# Patient Record
Sex: Female | Born: 1955 | Race: White | Hispanic: No | Marital: Married | Smoking: Former smoker
Health system: Southern US, Community
[De-identification: ages and names within clinical notes are randomized; demographics above are authoritative.]

## PROBLEM LIST (undated history)

## (undated) DIAGNOSIS — E785 Hyperlipidemia, unspecified: Secondary | ICD-10-CM

## (undated) DIAGNOSIS — H409 Unspecified glaucoma: Secondary | ICD-10-CM

## (undated) DIAGNOSIS — I480 Paroxysmal atrial fibrillation: Secondary | ICD-10-CM

## (undated) DIAGNOSIS — IMO0001 Reserved for inherently not codable concepts without codable children: Secondary | ICD-10-CM

## (undated) DIAGNOSIS — G4700 Insomnia, unspecified: Secondary | ICD-10-CM

## (undated) DIAGNOSIS — IMO0002 Reserved for concepts with insufficient information to code with codable children: Secondary | ICD-10-CM

## (undated) DIAGNOSIS — E669 Obesity, unspecified: Secondary | ICD-10-CM

## (undated) DIAGNOSIS — R001 Bradycardia, unspecified: Secondary | ICD-10-CM

## (undated) DIAGNOSIS — Z780 Asymptomatic menopausal state: Secondary | ICD-10-CM

## (undated) HISTORY — DX: Paroxysmal atrial fibrillation: I48.0

## (undated) HISTORY — PX: TONSILLECTOMY: SUR1361

## (undated) HISTORY — DX: Insomnia, unspecified: G47.00

## (undated) HISTORY — DX: Unspecified glaucoma: H40.9

## (undated) HISTORY — DX: Hyperlipidemia, unspecified: E78.5

## (undated) HISTORY — DX: Asymptomatic menopausal state: Z78.0

## (undated) HISTORY — PX: APPENDECTOMY: SHX54

## (undated) HISTORY — DX: Bradycardia, unspecified: R00.1

## (undated) HISTORY — DX: Obesity, unspecified: E66.9

## (undated) HISTORY — DX: Reserved for concepts with insufficient information to code with codable children: IMO0002

## (undated) HISTORY — PX: BUNIONECTOMY: SHX129

## (undated) HISTORY — PX: LAMINECTOMY: SHX219

## (undated) HISTORY — DX: Reserved for inherently not codable concepts without codable children: IMO0001

---

## 1998-08-06 ENCOUNTER — Encounter: Payer: Self-pay | Admitting: Obstetrics and Gynecology

## 1998-08-06 ENCOUNTER — Ambulatory Visit (HOSPITAL_COMMUNITY): Admission: RE | Admit: 1998-08-06 | Discharge: 1998-08-06 | Payer: Self-pay | Admitting: Obstetrics and Gynecology

## 1998-10-21 ENCOUNTER — Other Ambulatory Visit: Admission: RE | Admit: 1998-10-21 | Discharge: 1998-10-21 | Payer: Self-pay | Admitting: Obstetrics and Gynecology

## 1998-10-30 ENCOUNTER — Ambulatory Visit (HOSPITAL_COMMUNITY): Admission: RE | Admit: 1998-10-30 | Discharge: 1998-10-30 | Payer: Self-pay | Admitting: Obstetrics and Gynecology

## 1998-10-30 ENCOUNTER — Encounter: Payer: Self-pay | Admitting: Obstetrics and Gynecology

## 1999-10-16 ENCOUNTER — Ambulatory Visit (HOSPITAL_COMMUNITY): Admission: RE | Admit: 1999-10-16 | Discharge: 1999-10-16 | Payer: Self-pay | Admitting: Obstetrics and Gynecology

## 1999-10-16 ENCOUNTER — Encounter: Payer: Self-pay | Admitting: Obstetrics and Gynecology

## 2000-05-17 ENCOUNTER — Other Ambulatory Visit: Admission: RE | Admit: 2000-05-17 | Discharge: 2000-05-17 | Payer: Self-pay | Admitting: Obstetrics and Gynecology

## 2004-09-25 ENCOUNTER — Other Ambulatory Visit: Admission: RE | Admit: 2004-09-25 | Discharge: 2004-09-25 | Payer: Self-pay | Admitting: Family Medicine

## 2004-10-23 ENCOUNTER — Ambulatory Visit (HOSPITAL_COMMUNITY): Admission: RE | Admit: 2004-10-23 | Discharge: 2004-10-23 | Payer: Self-pay | Admitting: Family Medicine

## 2005-12-25 ENCOUNTER — Ambulatory Visit (HOSPITAL_COMMUNITY): Admission: RE | Admit: 2005-12-25 | Discharge: 2005-12-25 | Payer: Self-pay | Admitting: Family Medicine

## 2007-01-06 ENCOUNTER — Ambulatory Visit (HOSPITAL_COMMUNITY): Admission: RE | Admit: 2007-01-06 | Discharge: 2007-01-06 | Payer: Self-pay | Admitting: Family Medicine

## 2007-01-25 ENCOUNTER — Other Ambulatory Visit: Admission: RE | Admit: 2007-01-25 | Discharge: 2007-01-25 | Payer: Self-pay | Admitting: Family Medicine

## 2008-01-17 ENCOUNTER — Ambulatory Visit (HOSPITAL_COMMUNITY): Admission: RE | Admit: 2008-01-17 | Discharge: 2008-01-18 | Payer: Self-pay | Admitting: Neurosurgery

## 2008-08-08 ENCOUNTER — Ambulatory Visit (HOSPITAL_COMMUNITY): Admission: RE | Admit: 2008-08-08 | Discharge: 2008-08-08 | Payer: Self-pay | Admitting: Family Medicine

## 2009-09-27 ENCOUNTER — Ambulatory Visit (HOSPITAL_COMMUNITY): Admission: RE | Admit: 2009-09-27 | Discharge: 2009-09-27 | Payer: Self-pay | Admitting: Family Medicine

## 2009-10-08 ENCOUNTER — Emergency Department (HOSPITAL_COMMUNITY): Admission: EM | Admit: 2009-10-08 | Discharge: 2009-10-08 | Payer: Self-pay | Admitting: Emergency Medicine

## 2010-10-31 ENCOUNTER — Ambulatory Visit (HOSPITAL_COMMUNITY)
Admission: RE | Admit: 2010-10-31 | Discharge: 2010-10-31 | Payer: Self-pay | Source: Home / Self Care | Admitting: Family Medicine

## 2011-03-19 LAB — CBC
HCT: 43.9 % (ref 36.0–46.0)
Hemoglobin: 15.2 g/dL — ABNORMAL HIGH (ref 12.0–15.0)
MCHC: 34.5 g/dL (ref 30.0–36.0)
MCV: 92.5 fL (ref 78.0–100.0)
Platelets: 366 10*3/uL (ref 150–400)
RBC: 4.75 MIL/uL (ref 3.87–5.11)
RDW: 12.8 % (ref 11.5–15.5)
WBC: 10.8 10*3/uL — ABNORMAL HIGH (ref 4.0–10.5)

## 2011-03-19 LAB — POCT CARDIAC MARKERS
CKMB, poc: 1.6 ng/mL (ref 1.0–8.0)
CKMB, poc: 1.6 ng/mL (ref 1.0–8.0)
Myoglobin, poc: 47.8 ng/mL (ref 12–200)
Troponin i, poc: <0.05 ng/mL (ref 0.00–0.09)

## 2011-03-19 LAB — BASIC METABOLIC PANEL
BUN: 26 mg/dL — ABNORMAL HIGH (ref 6–23)
Calcium: 9.2 mg/dL (ref 8.4–10.5)
Creatinine, Ser: 0.67 mg/dL (ref 0.4–1.2)
GFR calc Af Amer: >60 mL/min (ref >60)
Sodium: 138 mEq/L (ref 135–145)

## 2011-03-19 LAB — URINALYSIS, ROUTINE W REFLEX MICROSCOPIC
Bilirubin Urine: NEGATIVE
Nitrite: NEGATIVE
Specific Gravity, Urine: 1.016 (ref 1.005–1.030)

## 2011-03-19 LAB — DIFFERENTIAL
Basophils Absolute: 0.1 10*3/uL (ref 0.0–0.1)
Basophils Relative: 1 % (ref 0–1)
Eosinophils Absolute: 0.1 10*3/uL (ref 0.0–0.7)
Eosinophils Relative: 1 % (ref 0–5)
Lymphocytes Relative: 28 % (ref 12–46)
Lymphs Abs: 3 10*3/uL (ref 0.7–4.0)
Monocytes Absolute: 0.6 10*3/uL (ref 0.1–1.0)
Monocytes Relative: 6 % (ref 3–12)
Neutro Abs: 7 10*3/uL (ref 1.7–7.7)
Neutrophils Relative %: 64 % (ref 43–77)

## 2011-03-19 LAB — D-DIMER, QUANTITATIVE: D-Dimer, Quant: <0.22 ug/mL-FEU (ref 0.00–0.48)

## 2011-04-28 NOTE — Op Note (Signed)
Stacy Silva, Stacy Silva           ACCOUNT NO.:  [id number]   MEDICAL RECORD NO.:  [id number]          PATIENT TYPE:  AMB   LOCATION:  SDS                          FACILITY:  [facility]   PHYSICIAN:  Clydene Fake, M.D.  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   POSTOPERATIVE DIAGNOSIS:  Thoracic spondylosis stenosis with myelopathy  T10-11.   PROCEDURE:  T9-12 decompressive laminectomy (three levels).   SURGEON:  Clydene Fake, M.D.   ASSISTANT:  Payton Doughty, M.D.   ANESTHESIA:  General endotracheal anesthesia.   ESTIMATED BLOOD LOSS:  Minima.   BLOOD REPLACED:  None.   DRAINS:  None.   COMPLICATIONS:  None.   INDICATIONS FOR PROCEDURE:  Patient a 55 year old woman who  has been  having back pain, tingling in the legs, found to be myelopathic on exam  with increased reflexes and clonus.  MRI shows thoracic spondylosis  stenosis at 10-11 level with compression of spinal cord.  Patient  brought in for decompression.   DESCRIPTION OF PROCEDURE:  General anesthesia induced.  Patient was  placed in appropriate position on Wilson frame.  All pressure points  padded.  Patient was prepped and draped in sterile fashion.  Needle was  placed in interspace at two spots.  Lateral x-rays obtained showing the  top needle was at the T11-12 interspinous space.  Incision was then made  in the midline thoracic spine from T9-12.  Incision down to fascia.  Hemostasis obtained with Bovie cauterization.  Subperiosteal dissection  was done over the spinous process of lamina out to the facets from  bottom of T9 to top of T12.  Self-retaining retractors were placed.  Markers were placed in either spinous space at 10-11 and 11-12.  Another  lateral x-ray was obtained confirming our positioning.  Decompressive  laminectomy was then performed  from the bottom of T9 to the top part of  T12 using Leksell rongeurs and then a high speed drill and then Kerrison  punches.  There is some significant posterior and lateral hypertrophic  ligaments at the 10-11 level and we decompressed this.  When we were  finished, we had good decompression of the thecal sac and spinal cord.  Hemostasis obtained with Gelfoam and thrombin.  This was then irrigated  out.  We had good hemostasis.  Retractors were removed and the fascia  was closed with 0 Vicryl interrupted sutures and subcutaneous tissue  closed with 0, 2-0  and 3-0 Vicryl interrupted sutures.  Skin closed with Dermabond and  light dressing was placed when that was dry.  Patient was placed back in  supine position, awoken from anesthesia and transferred to the recovery  room in stable condition.           ______________________________  Clydene Fake, M.D.     JRH/MEDQ  D:  01/17/2008  T:  01/17/2008  Job:  [id number]

## 2011-08-29 ENCOUNTER — Emergency Department (HOSPITAL_COMMUNITY)
Admission: EM | Admit: 2011-08-29 | Discharge: 2011-08-30 | Disposition: A | Discharge status: Home / Self Care | Payer: BC Managed Care – PPO | Attending: Emergency Medicine | Admitting: Emergency Medicine

## 2011-08-29 DIAGNOSIS — H409 Unspecified glaucoma: Secondary | ICD-10-CM | POA: Insufficient documentation

## 2011-08-29 DIAGNOSIS — I4891 Unspecified atrial fibrillation: Secondary | ICD-10-CM | POA: Insufficient documentation

## 2011-08-30 ENCOUNTER — Emergency Department (HOSPITAL_COMMUNITY): Payer: BC Managed Care – PPO

## 2011-08-30 LAB — DIFFERENTIAL
Basophils Absolute: 0 K/uL (ref 0.0–0.1)
Basophils Relative: 0 % (ref 0–1)
Eosinophils Absolute: 0.2 10*3/uL (ref 0.0–0.7)
Eosinophils Relative: 2 % (ref 0–5)
Lymphocytes Relative: 33 % (ref 12–46)
Lymphs Abs: 3 10*3/uL (ref 0.7–4.0)
Monocytes Absolute: 0.7 10*3/uL (ref 0.1–1.0)
Monocytes Relative: 7 % (ref 3–12)
Neutro Abs: 5.3 K/uL (ref 1.7–7.7)
Neutrophils Relative %: 58 % (ref 43–77)

## 2011-08-30 LAB — URINALYSIS, ROUTINE W REFLEX MICROSCOPIC
Bilirubin Urine: NEGATIVE
Glucose, UA: NEGATIVE mg/dL
Hgb urine dipstick: NEGATIVE
Ketones, ur: NEGATIVE mg/dL
Leukocytes, UA: NEGATIVE
Nitrite: NEGATIVE
Protein, ur: NEGATIVE mg/dL
Specific Gravity, Urine: 1.014 (ref 1.005–1.030)
Urobilinogen, UA: 0.2 mg/dL (ref 0.0–1.0)
pH: 7.5 (ref 5.0–8.0)

## 2011-08-30 LAB — CBC
HCT: 40.6 % (ref 36.0–46.0)
Hemoglobin: 14.5 g/dL (ref 12.0–15.0)
MCH: 30.5 pg (ref 26.0–34.0)
MCHC: 35.7 g/dL (ref 30.0–36.0)
MCV: 85.3 fL (ref 78.0–100.0)
Platelets: 339 K/uL (ref 150–400)
RBC: 4.76 MIL/uL (ref 3.87–5.11)
RDW: 12.3 % (ref 11.5–15.5)
WBC: 9.2 10*3/uL (ref 4.0–10.5)

## 2011-08-30 LAB — APTT: aPTT: 38 seconds — ABNORMAL HIGH (ref 24–37)

## 2011-08-30 LAB — BASIC METABOLIC PANEL
BUN: 24 mg/dL — ABNORMAL HIGH (ref 6–23)
Calcium: 9.8 mg/dL (ref 8.4–10.5)
Creatinine, Ser: 0.96 mg/dL (ref 0.50–1.10)
GFR calc Af Amer: >60 mL/min (ref >60)
GFR calc non Af Amer: >60 mL/min (ref >60)

## 2011-08-30 LAB — BASIC METABOLIC PANEL WITH GFR
CO2: 26 meq/L (ref 19–32)
Chloride: 102 meq/L (ref 96–112)
Glucose, Bld: 129 mg/dL — ABNORMAL HIGH (ref 70–99)
Potassium: 3.4 meq/L — ABNORMAL LOW (ref 3.5–5.1)
Sodium: 140 meq/L (ref 135–145)

## 2011-08-30 LAB — POCT I-STAT TROPONIN I: Troponin i, poc: 0 ng/mL (ref 0.00–0.08)

## 2011-08-30 LAB — PROTIME-INR
INR: 0.95 (ref 0.00–1.49)
Prothrombin Time: 12.9 s (ref 11.6–15.2)

## 2011-09-04 LAB — CBC
MCV: 89.7
Platelets: 334

## 2011-09-04 LAB — BASIC METABOLIC PANEL
CO2: 28
Calcium: 9.8
Creatinine, Ser: 0.7

## 2011-09-04 LAB — URINALYSIS, ROUTINE W REFLEX MICROSCOPIC
Bilirubin Urine: NEGATIVE
Hgb urine dipstick: NEGATIVE
Specific Gravity, Urine: 1.022
Urobilinogen, UA: 0.2

## 2011-09-04 LAB — APTT: aPTT: 34

## 2011-09-04 LAB — PROTIME-INR: Prothrombin Time: 12.6

## 2011-11-20 ENCOUNTER — Other Ambulatory Visit (HOSPITAL_COMMUNITY): Payer: Self-pay | Admitting: Family Medicine

## 2011-11-20 DIAGNOSIS — Z1231 Encounter for screening mammogram for malignant neoplasm of breast: Secondary | ICD-10-CM

## 2011-11-25 ENCOUNTER — Ambulatory Visit (HOSPITAL_COMMUNITY)
Admission: RE | Admit: 2011-11-25 | Discharge: 2011-11-25 | Disposition: A | Discharge status: Home / Self Care | Payer: BC Managed Care – PPO | Source: Ambulatory Visit | Attending: Family Medicine | Admitting: Family Medicine

## 2011-11-25 DIAGNOSIS — Z1231 Encounter for screening mammogram for malignant neoplasm of breast: Secondary | ICD-10-CM

## 2012-04-06 ENCOUNTER — Other Ambulatory Visit (HOSPITAL_COMMUNITY)
Admission: RE | Admit: 2012-04-06 | Discharge: 2012-04-06 | Disposition: A | Discharge status: Home / Self Care | Payer: BC Managed Care – PPO | Source: Ambulatory Visit | Attending: Family Medicine | Admitting: Family Medicine

## 2012-04-06 ENCOUNTER — Other Ambulatory Visit: Payer: Self-pay | Admitting: Family Medicine

## 2012-04-06 DIAGNOSIS — Z1159 Encounter for screening for other viral diseases: Secondary | ICD-10-CM | POA: Insufficient documentation

## 2012-04-06 DIAGNOSIS — Z124 Encounter for screening for malignant neoplasm of cervix: Secondary | ICD-10-CM | POA: Insufficient documentation

## 2012-06-15 ENCOUNTER — Other Ambulatory Visit: Payer: Self-pay | Admitting: Gastroenterology

## 2012-09-07 IMAGING — CR DG CHEST 1V PORT
1 series · 1 of 1 positions shown · non-contrast
Comparison: Chest radiograph performed 10/08/2009

CLINICAL DATA: Chest pain and palpitations; atrial fibrillation.

PORTABLE CHEST - 1 VIEW

[AP]
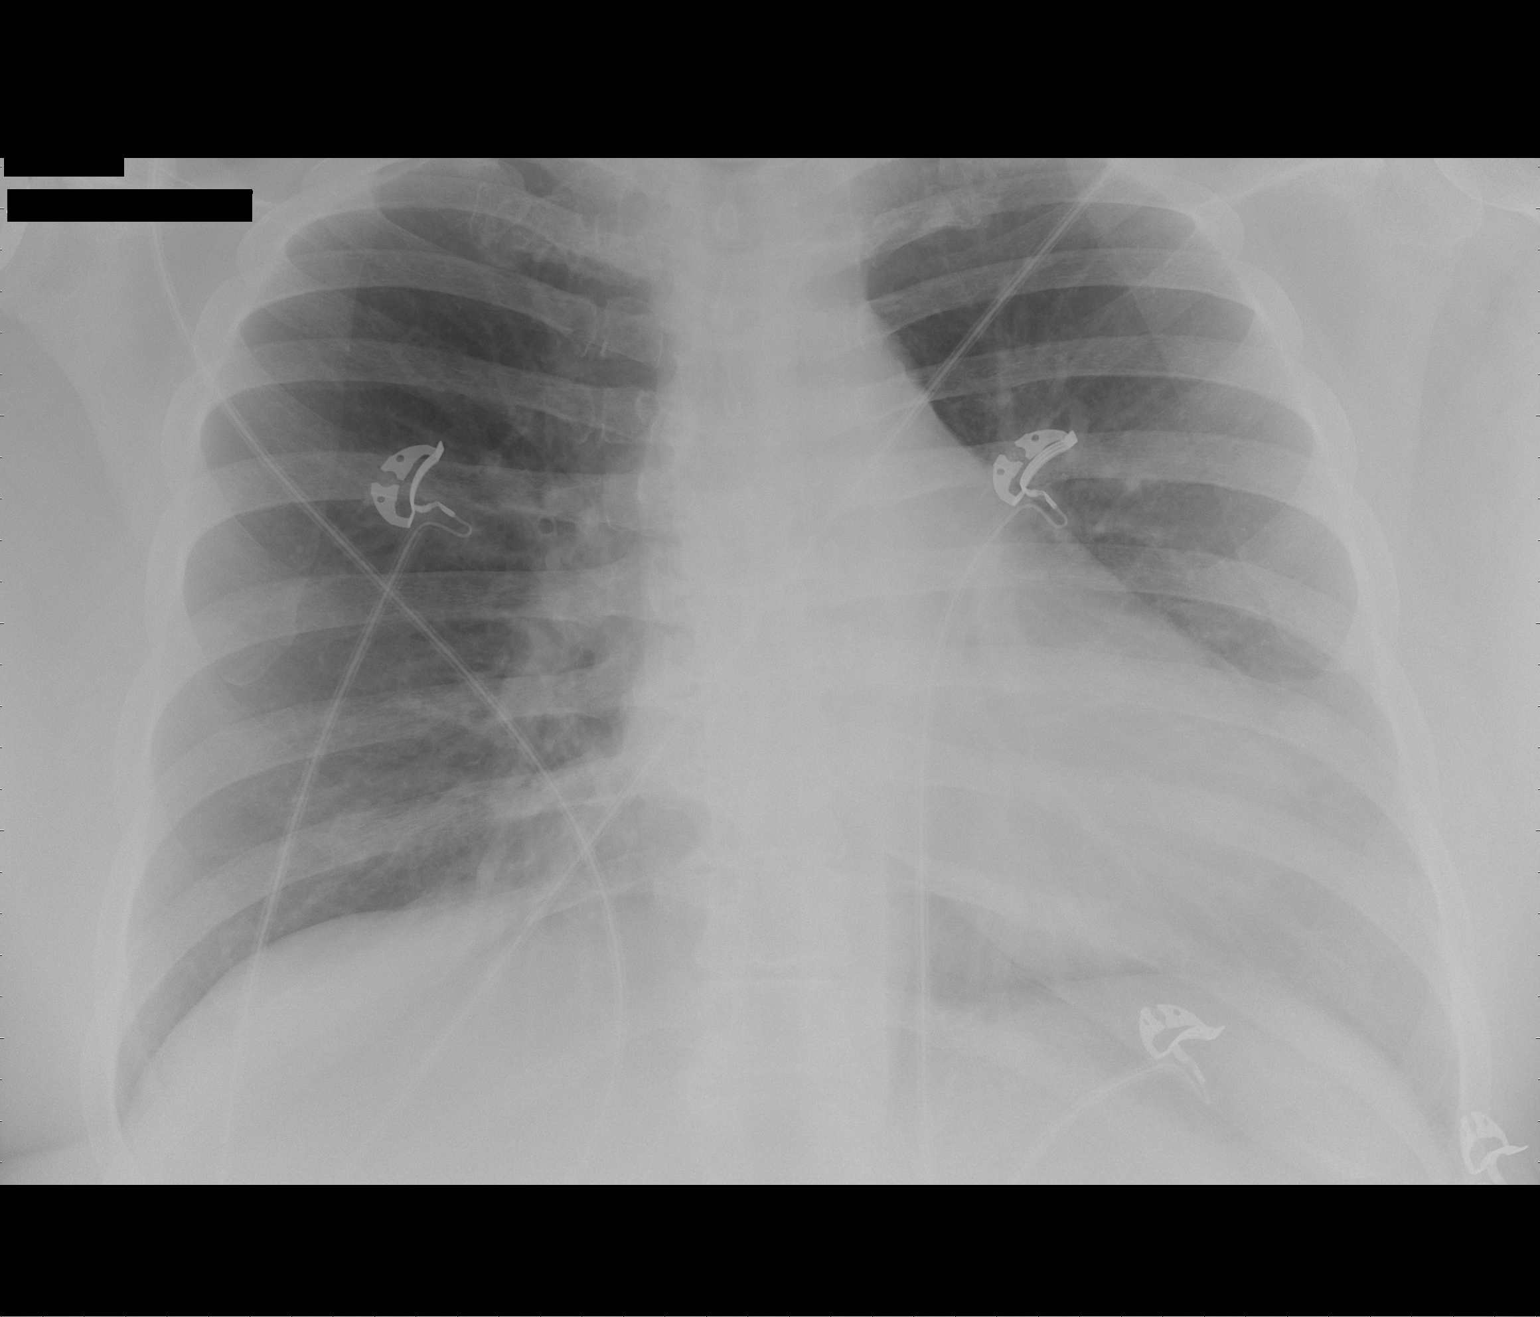

[1 of 1 positions shown; findings below may reference images not displayed]

FINDINGS: The lungs are well-aerated and appear essentially clear.
There is no evidence of focal opacification, pleural effusion or
pneumothorax.

The cardiomediastinal silhouette is mildly enlarged.  No acute
osseous abnormalities are seen.
IMPRESSION: Mild cardiomegaly; no acute cardiopulmonary process seen.

## 2012-11-14 ENCOUNTER — Other Ambulatory Visit (HOSPITAL_COMMUNITY): Payer: Self-pay | Admitting: Family Medicine

## 2012-11-14 DIAGNOSIS — Z1231 Encounter for screening mammogram for malignant neoplasm of breast: Secondary | ICD-10-CM

## 2012-12-01 ENCOUNTER — Ambulatory Visit (HOSPITAL_COMMUNITY)
Admission: RE | Admit: 2012-12-01 | Discharge: 2012-12-01 | Disposition: A | Discharge status: Home / Self Care | Payer: BC Managed Care – PPO | Source: Ambulatory Visit | Attending: Family Medicine | Admitting: Family Medicine

## 2012-12-01 DIAGNOSIS — Z1231 Encounter for screening mammogram for malignant neoplasm of breast: Secondary | ICD-10-CM

## 2013-09-24 ENCOUNTER — Encounter: Payer: Self-pay | Admitting: Cardiology

## 2013-09-24 ENCOUNTER — Encounter: Payer: Self-pay | Admitting: *Deleted

## 2013-09-27 ENCOUNTER — Ambulatory Visit: Payer: BC Managed Care – PPO | Admitting: Cardiology

## 2013-09-29 ENCOUNTER — Ambulatory Visit (INDEPENDENT_AMBULATORY_CARE_PROVIDER_SITE_OTHER): Payer: BC Managed Care – PPO | Admitting: Cardiology

## 2013-09-29 ENCOUNTER — Encounter: Payer: Self-pay | Admitting: Cardiology

## 2013-09-29 VITALS — BP 100/76 | HR 50 | Ht 63.0 in | Wt 175.0 lb

## 2013-09-29 DIAGNOSIS — E669 Obesity, unspecified: Secondary | ICD-10-CM

## 2013-09-29 DIAGNOSIS — E785 Hyperlipidemia, unspecified: Secondary | ICD-10-CM

## 2013-09-29 DIAGNOSIS — I4891 Unspecified atrial fibrillation: Secondary | ICD-10-CM

## 2013-09-29 NOTE — Patient Instructions (Signed)
Your physician has requested that you have an echocardiogram. Echocardiography is a painless test that uses sound waves to create images of your heart. It provides your doctor with information about the size and shape of your heart and how well your heart's chambers and valves are working. This procedure takes approximately one hour. There are no restrictions for this procedure.  You have been referred to Dr. Johney Frame for Atrial Fibrillation.  Your physician wants you to follow-up in: 6 months with Dr. Anne Fu. You will receive a reminder letter in the mail two months in advance. If you don't receive a letter, please call our office to schedule the follow-up appointment.

## 2013-09-29 NOTE — Progress Notes (Signed)
[address] [place], [place]  [address] Phone: [phone] Fax:  [phone]  Date:  09/29/2013   ID:  Silva, Stacy 1956/11/01, MRN [id number]  PCP:  Gweneth Dimitri, MD   History of Present Illness: Stacy Silva is a 57 y.o. female with paroxysmal atrial fibrillation here for followup. First episode was November 2010. Echocardiogram, normal left atrial size. Heart rate was as high as 160 at that time. She also had sinus bradycardia with rate of 43 with no symptoms. She's been feeling palpitations approximate once every 2-3 weeks at last discussion. We discussed the possibility of antiarrhythmic such as flecainide but she wished to hold off at that time. Currently on a combination of diltiazem and metoprolol low-dose.  Having afib, once every 2 weeks. Starts at night then lasts about 24-36 hours. Varies in how debilitating it is. Dilt keeps heart rate under control and has less energy with AFIB.  Now feeling like heart is fluttering but still in rhythm. Does not drink at all because she is worried about trigger. She has not had a previous sleep study but she does not snore nor does she have daytime somnolence.   Wt Readings from Last 3 Encounters:  09/29/13 175 lb (79.379 kg)     Past Medical History  Diagnosis Date  . Obesity   . Back pain   . Myalgia and myositis, unspecified   . Insomnia   . Atrial fibrillation   . Hyperlipidemia   . Hyperlipidemia   . Glaucoma   . Atrial fibrillation   . Postmenopausal     Past Surgical History  Procedure Laterality Date  . Tonsillectomy    . Appendectomy    . Bunionectomy    . Laminectomy      Current Outpatient Prescriptions  Medication Sig Dispense Refill  . aspirin 325 MG tablet Take 325 mg by mouth daily.      . cyclobenzaprine (FLEXERIL) 10 MG tablet Take 10 mg by mouth 3 (three) times daily as needed for muscle spasms.      Marland Kitchen diltiazem (CARDIZEM) 60 MG tablet Take 60 mg by mouth as  needed.      . metoprolol succinate (TOPROL-XL) 50 MG 24 hr tablet Take ONE AND HALF TABLET IN THE MORNING AND TWO TABLET AT NIGHT DAILY      . Multiple Vitamin (MULTIVITAMIN) capsule Take 1 capsule by mouth daily.      . NON FORMULARY TAKE ONE PILL OF PROTANDIM DAILY      . Omega-3 Fatty Acids ([employer] FISH OIL) 1000 MG CAPS Take by mouth. TAKE 2 CAPSULES DAILY       No current facility-administered medications for this visit.    Allergies:    Allergies  Allergen Reactions  . Amoxil [Amoxicillin]     SKIN RASH  . Erythromycin     CAUSE NAUSEA AND VOMITING  . Tape     RASH AND BLISTERS    Social History:  The patient  reports that she has quit smoking. She does not have any smokeless tobacco history on file. She reports that she does not drink alcohol or use illicit drugs.   ROS:  Please see the history of present illness.   No bleeding, no syncope, no orthopnea, no PND  All other systems reviewed and negative.   PHYSICAL EXAM: VS:  BP 100/76  Pulse 50  Ht 5\' 3"  (1.6 m)  Wt 175 lb (79.379 kg)  BMI 31.01  kg/m2  SpO2 97% Well nourished, well developed, in no acute distress HEENT: normal Neck: no JVD Cardiac:  normal S1, S2; bradycardic but regular,no murmur Lungs:  clear to auscultation bilaterally, no wheezing, rhonchi or rales Abd: soft, nontender, no hepatomegaly Ext: no edema Skin: warm and dry Neuro: no focal abnormalities noted  EKG:  Sinus bradycardia rate 50, possible left atrial enlargement Echocardiogram: 2010-left atrial size 39 mm. Normal EF Nuclear stress test: 2012-low-risk, no ischemia, normal EF     ASSESSMENT AND PLAN:  1. Paroxysmal atrial fibrillation-she is now having more frequent episodes, slightly longer duration. At last visit 6 months ago we discussed the possibility of antiarrhythmic medications. She seems willing to proceed at this point and I would like for her to sit down and discuss her case with Dr. Hillis Range electrophysiology. I expressed  that given her paroxysmal atrial fibrillation, she is in an advantage for current antiarrhythmic/possible ablative therapy. I will also repeat echocardiogram since it has been 4 years to detect her left atrial dimension. CHADS-VAS is 1 (Female), hence no anticoagulation currently.  Signed, Donato Schultz, MD [facility]  09/29/2013 3:50 PM

## 2013-09-30 ENCOUNTER — Encounter: Payer: Self-pay | Admitting: Cardiology

## 2013-09-30 DIAGNOSIS — E785 Hyperlipidemia, unspecified: Secondary | ICD-10-CM | POA: Insufficient documentation

## 2013-09-30 DIAGNOSIS — E669 Obesity, unspecified: Secondary | ICD-10-CM | POA: Insufficient documentation

## 2013-10-12 ENCOUNTER — Other Ambulatory Visit: Payer: Self-pay | Admitting: Cardiology

## 2013-10-18 ENCOUNTER — Ambulatory Visit (HOSPITAL_COMMUNITY): Payer: BC Managed Care – PPO | Attending: Cardiology | Admitting: Cardiology

## 2013-10-18 DIAGNOSIS — I4891 Unspecified atrial fibrillation: Secondary | ICD-10-CM | POA: Insufficient documentation

## 2013-10-18 DIAGNOSIS — Z6831 Body mass index (BMI) 31.0-31.9, adult: Secondary | ICD-10-CM | POA: Insufficient documentation

## 2013-10-18 DIAGNOSIS — E785 Hyperlipidemia, unspecified: Secondary | ICD-10-CM | POA: Insufficient documentation

## 2013-10-18 DIAGNOSIS — E669 Obesity, unspecified: Secondary | ICD-10-CM | POA: Insufficient documentation

## 2013-10-18 NOTE — Progress Notes (Signed)
Echo performed. 

## 2013-11-13 ENCOUNTER — Ambulatory Visit (INDEPENDENT_AMBULATORY_CARE_PROVIDER_SITE_OTHER): Payer: BC Managed Care – PPO | Admitting: Internal Medicine

## 2013-11-13 ENCOUNTER — Encounter: Payer: Self-pay | Admitting: Internal Medicine

## 2013-11-13 VITALS — BP 110/70 | HR 52 | Ht 63.0 in | Wt 177.1 lb

## 2013-11-13 DIAGNOSIS — I4891 Unspecified atrial fibrillation: Secondary | ICD-10-CM

## 2013-11-13 MED ORDER — FLECAINIDE ACETATE 50 MG PO TABS: 50.0000 mg | ORAL_TABLET | Freq: Two times a day (BID) | ORAL | Status: DC

## 2013-11-13 NOTE — Progress Notes (Signed)
Primary Care Physician: Gweneth Dimitri, MD Referring Physician:  Dr Anne Fu   Stacy Silva is a 57 y.o. female with a h/o paroxysmal atrial fibrillation who presents today for EP consultation.  She was initially diagnosed with atrial fibrillation 09/2009 after presenting to [facility] with symptoms of palpitations and dizziness.  She was confirmed to have afib.  In retrospect, she has had similar symptoms for at least a year prior.  She spontaneously converted to sinus rhythm.  She had another episode 3 weeks later while wearing an event monitor.  She was placed on metoprolol and ASA.  She reports increasing frequency and duration of her atrial fibrillation.  Presently, episodes occur every 2 weeks and last 1-2 days at a time.  She has not tolerated long acting diltiazem.  She has not tried AAD therapy.  She has not required cardioversion previously.  She is unaware of triggers or precipitants for her afib.  She has rarely felt that 2 glasses of champaign will cause afib.  During afib, she has palpitations and fatigue.  She feels like she has been "hit by a truck" afterwards.  She has some chest tightness with her afib.  Today, she denies symptoms of shortness of breath, orthopnea, PND, lower extremity edema, syncope, or neurologic sequela.  She has had several brief presyncopal events when she converts from afib to sinus though this is infrequent. The patient is tolerating medications without difficulties and is otherwise without complaint today.   Past Medical History  Diagnosis Date  . Obesity   . DDD (degenerative disc disease)   . Myalgia and myositis, unspecified   . Insomnia   . Hyperlipidemia   . Glaucoma   . Paroxysmal atrial fibrillation   . Postmenopausal    Past Surgical History  Procedure Laterality Date  . Tonsillectomy    . Appendectomy    . Bunionectomy    . Laminectomy      Current Outpatient Prescriptions  Medication Sig Dispense Refill  . aspirin 325 MG tablet  Take 325 mg by mouth daily.      . cyclobenzaprine (FLEXERIL) 10 MG tablet Take 10 mg by mouth 3 (three) times daily as needed for muscle spasms.      Marland Kitchen diltiazem (CARDIZEM) 60 MG tablet Take 60 mg by mouth as needed.      Marland Kitchen ELIDEL 1 % cream Apply 1 application topically daily.      Marland Kitchen latanoprost (XALATAN) 0.005 % ophthalmic solution Place 1 drop into both eyes at bedtime.      . metoprolol succinate (TOPROL-XL) 50 MG 24 hr tablet take 1 and 1/2 tablets by mouth every morning and 2 every evening once daily  105 tablet  5  . Multiple Vitamin (MULTIVITAMIN) capsule Take 1 capsule by mouth daily.      . NON FORMULARY TAKE ONE PILL OF PROTANDIM DAILY      . Omega-3 Fatty Acids ([employer] FISH OIL) 1000 MG CAPS TAKE 2 CAPSULES DAILY       No current facility-administered medications for this visit.    Allergies  Allergen Reactions  . Amoxil [Amoxicillin]     SKIN RASH  . Erythromycin     CAUSE NAUSEA AND VOMITING  . Tape     RASH AND BLISTERS    History   Social History  . Marital Status: Married    Spouse Name: N/A    Number of Children: N/A  . Years of Education: N/A   Occupational History  . Not on  file.   Social History Main Topics  . Smoking status: Former Games developer  . Smokeless tobacco: Not on file     Comment: PATIENT QUIT ON 10/ 26/2010  . Alcohol Use: Yes     Comment: rare  . Drug Use: No  . Sexual Activity: Not on file   Other Topics Concern  . Not on file   Social History Narrative   Pt lives in [place] with spouse.  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 Heart attack Maternal Grandfather     DECEASED  . Congestive Heart Failure Father     DECEASD  . Heart attack Father     DECEASED  . Heart attack Paternal Grandfather     DECEASED    ROS- All systems are reviewed and negative except as per the HPI above  Physical Exam: Filed Vitals:   11/13/13 1417  BP: 110/70  Pulse: 52    Height: 5\' 3"  (1.6 m)  Weight: 177 lb 1.9 oz (80.341 kg)    GEN- The patient is well appearing, alert and oriented x 3 today.   Head- normocephalic, atraumatic Eyes-  Sclera clear, conjunctiva pink Ears- hearing intact Oropharynx- clear Neck- supple, no JVP Lymph- no cervical lymphadenopathy Lungs- Clear to ausculation bilaterally, normal work of breathing Heart- Regular rate and rhythm, no murmurs, rubs or gallops, PMI not laterally displaced GI- soft, NT, ND, + BS Extremities- no clubbing, cyanosis, or edema MS- no significant deformity or atrophy Skin- no rash or lesion Psych- euthymic mood, full affect Neuro- strength and sensation are intact  EKG today reveals sinus rhythm 52 bpm, PR 160, QRS 86, QTc 425 otherwise normal ekg Echo 10/18/13- EF 60-65%, no significant valvular disease, LA size is 39 mm  Assessment and Plan:  1. Paroxysmal atrial fibrillation The patient has symptomatic paroxysmal atrial fibrillation.  She has not tried AAD therapy.  Her CHADS2VASC score is 1.  She is therefore not on anticoagulation as per guidelines.  Therapeutic strategies for afib including medicine and ablation were discussed in detail with the patient today. Risk, benefits, and alternatives to EP study and radiofrequency ablation for afib were also discussed in detail today.  At this point, we would prefer to try AAD therapy.  I will therefore initiate flecainide 50mg  BID.  This can be further uptitrated to 100mg  BID if her afib is not controlled.  She will follow-up with Dr Anne Fu in 6 weeks.  IF her afib is improved with flecainide then she will need a GXT myoview at that point to evaluate for ischemia/ exercise induced arrhythmias.  If she has further afib or does not tolerate flecainide then she may be willing to consider ablation at that time. She will follow with Dr Anne Fu going forward and will contact my office should she wish to consider ablation.

## 2013-11-13 NOTE — Patient Instructions (Signed)
Your physician recommends that you schedule a follow-up appointment in: 6 weeks with Dr Anne Fu   Your physician has recommended you make the following change in your medication:  1) Start Flecainide 50mg  twice daily

## 2013-11-20 ENCOUNTER — Other Ambulatory Visit (HOSPITAL_COMMUNITY): Payer: Self-pay | Admitting: Family Medicine

## 2013-11-20 DIAGNOSIS — Z1231 Encounter for screening mammogram for malignant neoplasm of breast: Secondary | ICD-10-CM

## 2013-12-05 ENCOUNTER — Ambulatory Visit (HOSPITAL_COMMUNITY): Payer: BC Managed Care – PPO

## 2013-12-19 ENCOUNTER — Ambulatory Visit (HOSPITAL_COMMUNITY)
Admission: RE | Admit: 2013-12-19 | Discharge: 2013-12-19 | Disposition: A | Discharge status: Home / Self Care | Payer: BC Managed Care – PPO | Source: Ambulatory Visit | Attending: Family Medicine | Admitting: Family Medicine

## 2013-12-19 DIAGNOSIS — Z1231 Encounter for screening mammogram for malignant neoplasm of breast: Secondary | ICD-10-CM

## 2013-12-25 ENCOUNTER — Encounter: Payer: Self-pay | Admitting: Cardiology

## 2013-12-25 ENCOUNTER — Ambulatory Visit (INDEPENDENT_AMBULATORY_CARE_PROVIDER_SITE_OTHER): Payer: BC Managed Care – PPO | Admitting: Cardiology

## 2013-12-25 ENCOUNTER — Encounter: Payer: Self-pay | Admitting: Internal Medicine

## 2013-12-25 VITALS — BP 112/76 | HR 56 | Ht 63.0 in | Wt 178.0 lb

## 2013-12-25 DIAGNOSIS — Z79899 Other long term (current) drug therapy: Secondary | ICD-10-CM

## 2013-12-25 DIAGNOSIS — I4891 Unspecified atrial fibrillation: Secondary | ICD-10-CM

## 2013-12-25 MED ORDER — FLECAINIDE ACETATE 100 MG PO TABS: 100.0000 mg | ORAL_TABLET | Freq: Two times a day (BID) | ORAL | Status: DC

## 2013-12-25 NOTE — Progress Notes (Signed)
[place], [place],   [address] Phone: [phone] Fax:  [phone]  Date:  12/25/2013   ID:  Stacy, Silva [birth year], MRN [id number]  PCP:  Cari Caraway, MD   History of Present Illness: Stacy Silva is a 58 y.o. female with paroxysmal atrial fibrillation here for followup. First episode was November 2010. Echocardiogram, normal left atrial size. Heart rate was as high as 160 at that time. She also had sinus bradycardia with rate of 43 with no symptoms. She's been feeling palpitations approximate once every 2-3 weeks at last discussion. We discussed the possibility of antiarrhythmic such as flecainide but she wished to hold off at that time. Currently on a combination of diltiazem and metoprolol low-dose.  Having afib, once every 2 weeks. Starts at night then lasts about 24-36 hours. Varies in how debilitating it is. Dilt keeps heart rate under control and has less energy with AFIB.  Now feeling like heart is fluttering but still in rhythm. Does not drink at all because she is worried about trigger. She has not had a previous sleep study but she does not snore nor does she have daytime somnolence.  12/25/13-she has seen Dr. Rayann Heman in consultation who started her on flecainide 50 mg twice a day. She has had much less frequent bouts of atrial fibrillation. Her longest bout was approximately 12 hours which is much improved. No syncope, no bleeding, no orthopnea, no PND. EKG reassuring.  Wt Readings from Last 3 Encounters:  12/25/13 178 lb (80.74 kg)  11/13/13 177 lb 1.9 oz (80.341 kg)  09/29/13 175 lb (79.379 kg)     Past Medical History  Diagnosis Date  . Obesity   . DDD (degenerative disc disease)   . Myalgia and myositis, unspecified   . Insomnia   . Hyperlipidemia   . Glaucoma   . Paroxysmal atrial fibrillation   . Postmenopausal     Past Surgical History  Procedure Laterality Date  . Tonsillectomy    . Appendectomy    .  Bunionectomy    . Laminectomy      Current Outpatient Prescriptions  Medication Sig Dispense Refill  . aspirin 325 MG tablet Take 325 mg by mouth daily.      . cyclobenzaprine (FLEXERIL) 10 MG tablet Take 10 mg by mouth 3 (three) times daily as needed for muscle spasms.      Marland Kitchen diltiazem (CARDIZEM) 60 MG tablet Take 60 mg by mouth as needed.      Marland Kitchen ELIDEL 1 % cream Apply 1 application topically daily.      . flecainide (TAMBOCOR) 50 MG tablet Take 1 tablet (50 mg total) by mouth 2 (two) times daily.  180 tablet  3  . latanoprost (XALATAN) 0.005 % ophthalmic solution Place 1 drop into both eyes at bedtime.      . metoprolol succinate (TOPROL-XL) 50 MG 24 hr tablet take 1 and 1/2 tablets by mouth every morning and 2 every evening once daily  105 tablet  5  . Multiple Vitamin (MULTIVITAMIN) capsule Take 1 capsule by mouth daily.      . NON FORMULARY TAKE ONE PILL OF PROTANDIM DAILY      . Omega-3 Fatty Acids ([employer] FISH OIL) 1000 MG CAPS TAKE 2 CAPSULES DAILY       No current facility-administered medications for this visit.    Allergies:    Allergies  Allergen Reactions  . Amoxil [Amoxicillin]  SKIN RASH  . Erythromycin     CAUSE NAUSEA AND VOMITING  . Tape     RASH AND BLISTERS    Social History:  The patient  reports that she has quit smoking. She does not have any smokeless tobacco history on file. She reports that she drinks alcohol. She reports that she does not use illicit drugs.   ROS:  Please see the history of present illness.   No bleeding, no syncope, no orthopnea, no PND  All other systems reviewed and negative.   PHYSICAL EXAM: VS:  BP 112/76  Pulse 56  Ht 5\' 3"  (1.6 m)  Wt 178 lb (80.74 kg)  BMI 31.54 kg/m2  SpO2 96% Well nourished, well developed, in no acute distress HEENT: normal Neck: no JVD Cardiac:  normal S1, S2; bradycardic but regular,no murmur Lungs:  clear to auscultation bilaterally, no wheezing, rhonchi or rales Abd: soft, nontender, no  hepatomegaly Ext: no edema Skin: warm and dry Neuro: no focal abnormalities noted  EKG: 12/25/13- Sinus bradycardia rate 53, possible left atrial enlargement Echocardiogram: 10/2013- left atrial size 39 mm. Normal EF Nuclear stress test: 2012-low-risk, no ischemia, normal EF     ASSESSMENT AND PLAN:  1. Paroxysmal atrial fibrillation-recently started flecainide 50 mg twice a day. Dr. Rayann Heman saw in  because she is still having bouts of A. fib, I will increase her flecainide 100 mg twice a day. EKG today shows QRS duration of 84 ms. Sinus bradycardia 53 beats per minute. CHADS-VAS is 1 (Female), hence no anticoagulation currently. Left atrial size is normal. Nuclear stress test in 2012 with no ischemia. I will go ahead and set her up for an exercise treadmill test to ensure that she does not have any exercise-induced arrhythmias with flecainide. Overall, she is very satisfied with the use of antiarrhythmic drugs. She states that she wishes to on these earlier. 2. We will see her back in 2 months.  Signed, Candee Furbish, MD [facility]  12/25/2013 11:53 AM

## 2013-12-25 NOTE — Patient Instructions (Signed)
Your physician recommends that you schedule a follow-up appointment in: 2 months with Dr. Marlou Porch  Your physician has requested that you have an exercise tolerance test. For further information please visit [email or web address]. Please also follow instruction sheet, as given.  Your physician has recommended you make the following change in your medication:   1. Increase your Flecainide to 100 mg twice daily

## 2014-01-04 ENCOUNTER — Ambulatory Visit (HOSPITAL_COMMUNITY)
Admission: RE | Admit: 2014-01-04 | Discharge: 2014-01-04 | Disposition: A | Discharge status: Home / Self Care | Payer: BC Managed Care – PPO | Source: Ambulatory Visit | Attending: Internal Medicine | Admitting: Internal Medicine

## 2014-01-04 DIAGNOSIS — I4891 Unspecified atrial fibrillation: Secondary | ICD-10-CM | POA: Insufficient documentation

## 2014-02-26 ENCOUNTER — Ambulatory Visit (INDEPENDENT_AMBULATORY_CARE_PROVIDER_SITE_OTHER): Payer: BC Managed Care – PPO | Admitting: Cardiology

## 2014-02-26 ENCOUNTER — Encounter: Payer: Self-pay | Admitting: Cardiology

## 2014-02-26 VITALS — BP 116/60 | HR 64 | Ht 63.0 in | Wt 177.0 lb

## 2014-02-26 DIAGNOSIS — Z79899 Other long term (current) drug therapy: Secondary | ICD-10-CM

## 2014-02-26 DIAGNOSIS — R5383 Other fatigue: Secondary | ICD-10-CM

## 2014-02-26 DIAGNOSIS — R5381 Other malaise: Secondary | ICD-10-CM

## 2014-02-26 DIAGNOSIS — I4891 Unspecified atrial fibrillation: Secondary | ICD-10-CM

## 2014-02-26 NOTE — Progress Notes (Signed)
[place]. [address] [place], [place]  [address] Phone: [phone] Fax:  [phone]  Date:  02/26/2014   ID:  Stacy Silva, Stacy Silva February 25, 1956, MRN [id number]  PCP:  Cari Caraway, MD   History of Present Illness: Stacy Silva is a 58 y.o. female with paroxysmal atrial fibrillation here for followup. First episode was November 2010. Echocardiogram, normal left atrial size. Heart rate was as high as 160 at that time. She also had sinus bradycardia with rate of 43 with no symptoms. She's been feeling palpitations approximate once every 2-3 weeks at last discussion. We discussed the possibility of antiarrhythmic such as flecainide but she wished to hold off at that time. Currently on a combination of diltiazem and metoprolol low-dose.  Having afib, once every 2 weeks. Starts at night then lasts about 24-36 hours. Varies in how debilitating it is. Dilt keeps heart rate under control and has less energy with AFIB.  Now feeling like heart is fluttering but still in rhythm. Does not drink at all because she is worried about trigger. She has not had a previous sleep study but she does not snore nor does she have daytime somnolence.  12/25/13-she has seen Dr. Rayann Heman in consultation who started her on flecainide 50 mg twice a day. She has had much less frequent bouts of atrial fibrillation. Her longest bout was approximately 12 hours which is much improved. No syncope, no bleeding, no orthopnea, no PND. EKG reassuring.  02/26/14 - Think she is having only minor symptoms or side effects of medication. On 100mg  BID for a while, backed off to 50 but had more AFIB. Went back to 100 BID. Feels a little sluggish.   Wt Readings from Last 3 Encounters:  02/26/14 177 lb (80.287 kg)  12/25/13 178 lb (80.74 kg)  11/13/13 177 lb 1.9 oz (80.341 kg)     Past Medical History  Diagnosis Date  . Obesity   . DDD (degenerative disc disease)   . Myalgia and myositis, unspecified   .  Insomnia   . Hyperlipidemia   . Glaucoma   . Paroxysmal atrial fibrillation   . Postmenopausal     Past Surgical History  Procedure Laterality Date  . Tonsillectomy    . Appendectomy    . Bunionectomy    . Laminectomy      Current Outpatient Prescriptions  Medication Sig Dispense Refill  . aspirin 325 MG tablet Take 325 mg by mouth daily.      . cyclobenzaprine (FLEXERIL) 10 MG tablet Take 10 mg by mouth 3 (three) times daily as needed for muscle spasms.      Marland Kitchen diltiazem (CARDIZEM) 60 MG tablet Take 60 mg by mouth as needed.      Marland Kitchen ELIDEL 1 % cream Apply 1 application topically daily.      . flecainide (TAMBOCOR) 100 MG tablet Take 1 tablet (100 mg total) by mouth 2 (two) times daily.  180 tablet  3  . latanoprost (XALATAN) 0.005 % ophthalmic solution Place 1 drop into both eyes at bedtime.      . metoprolol succinate (TOPROL-XL) 50 MG 24 hr tablet take 1 and 1/2 tablets by mouth every morning and 2 every evening once daily  105 tablet  5  . Multiple Vitamin (MULTIVITAMIN) capsule Take 1 capsule by mouth daily.      . NON FORMULARY TAKE ONE PILL OF PROTANDIM DAILY      . Omega-3 Fatty Acids ([employer]  FISH OIL) 1000 MG CAPS TAKE 2 CAPSULES DAILY       No current facility-administered medications for this visit.    Allergies:    Allergies  Allergen Reactions  . Amoxil [Amoxicillin]     SKIN RASH  . Erythromycin     CAUSE NAUSEA AND VOMITING  . Tape     RASH AND BLISTERS    Social History:  The patient  reports that she has quit smoking. She does not have any smokeless tobacco history on file. She reports that she drinks alcohol. She reports that she does not use illicit drugs.   ROS:  Please see the history of present illness.   No bleeding, no syncope, no orthopnea, no PND  All other systems reviewed and negative.   PHYSICAL EXAM: VS:  BP 116/60  Pulse 64  Ht 5\' 3"  (1.6 m)  Wt 177 lb (80.287 kg)  BMI 31.36 kg/m2 Well nourished, well developed, in no acute  distress HEENT: normal Neck: no JVD Cardiac:  normal S1, S2; bradycardic but regular,no murmur Lungs:  clear to auscultation bilaterally, no wheezing, rhonchi or rales Abd: soft, nontender, no hepatomegaly Ext: no edema Skin: warm and dry Neuro: no focal abnormalities noted  EKG: 12/25/13- Sinus bradycardia rate 53, possible left atrial enlargement Echocardiogram: 10/2013- left atrial size 39 mm. Normal EF Nuclear stress test: 2012-low-risk, no ischemia, normal EF     ASSESSMENT AND PLAN:  1. Paroxysmal atrial fibrillation- flecainide 100 mg twice a day. EKG shows QRS duration of 84 ms. Sinus bradycardia 53 beats per minute. CHADS-VAS is 1 (Female), hence no anticoagulation currently. Left atrial size is normal. Nuclear stress test in 2012 with no ischemia.Exercise treadmill test reassuring on flecainide. Overall, she is very satisfied with the use of antiarrhythmic drugs. She states that she wishes to on these earlier. 2. Fatigue-mild. Continue to exercise. Overall very impressed. She is to contact us if any worrisome symptoms develop. 3. We will see her back in 6 months.  Signed, Candee Furbish, MD [facility]  02/26/2014 4:12 PM

## 2014-02-26 NOTE — Patient Instructions (Signed)
Your physician recommends that you continue on your current medications as directed. Please refer to the Current Medication list given to you today.  Your physician wants you to follow-up in: 6 months with Dr. Skains. You will receive a reminder letter in the mail two months in advance. If you don't receive a letter, please call our office to schedule the follow-up appointment.  

## 2014-04-05 ENCOUNTER — Other Ambulatory Visit: Payer: Self-pay | Admitting: Cardiology

## 2014-09-28 ENCOUNTER — Other Ambulatory Visit: Payer: Self-pay

## 2014-10-06 ENCOUNTER — Other Ambulatory Visit: Payer: Self-pay | Admitting: Cardiology

## 2014-10-08 ENCOUNTER — Telehealth: Payer: Self-pay | Admitting: Cardiology

## 2014-10-08 MED ORDER — METOPROLOL SUCCINATE ER 50 MG PO TB24: ORAL_TABLET | ORAL | Status: DC

## 2014-10-08 NOTE — Telephone Encounter (Signed)
Refill sent into pharmacy as requested.  Pt also scheduled to f/u with Dr Marlou Porch.

## 2014-10-08 NOTE — Telephone Encounter (Signed)
°  Patient is completely out of medication and Rose has left for the day. She needs Metoprolo. Please call and advise.

## 2014-10-30 ENCOUNTER — Encounter: Payer: Self-pay | Admitting: Cardiology

## 2014-10-30 ENCOUNTER — Ambulatory Visit (INDEPENDENT_AMBULATORY_CARE_PROVIDER_SITE_OTHER): Payer: BC Managed Care – PPO | Admitting: Cardiology

## 2014-10-30 VITALS — BP 116/78 | HR 53 | Ht 63.0 in | Wt 181.0 lb

## 2014-10-30 DIAGNOSIS — I480 Paroxysmal atrial fibrillation: Secondary | ICD-10-CM

## 2014-10-30 NOTE — Patient Instructions (Addendum)
The current medical regimen is effective;  continue present plan and medications.  Follow up in 6 months with Dr. Skains.  You will receive a letter in the mail 2 months before you are due.  Please call us when you receive this letter to schedule your follow up appointment.  Thank you for choosing [facility]!!     

## 2014-10-30 NOTE — Progress Notes (Signed)
[place]. [address] [place], [place]  [address] Phone: [phone] Fax:  [phone]  Date:  10/30/2014   ID:  Stacy, Silva 01/02/1956, MRN [id number]  PCP:  Cari Caraway, MD   History of Present Illness: Stacy Silva is a 58 y.o. female with paroxysmal atrial fibrillation here for followup. First episode was November 2010. Echocardiogram, normal left atrial size. Heart rate was as high as 160 at that time. She also had sinus bradycardia with rate of 43 with no symptoms. She's been feeling palpitations approximate once every 2-3 weeks at last discussion. We discussed the possibility of antiarrhythmic such as flecainide but she wished to hold off at that time. Currently on a combination of diltiazem and metoprolol low-dose.  Having afib, once every 2 weeks. Starts at night then lasts about 24-36 hours. Varies in how debilitating it is. Dilt keeps heart rate under control and has less energy with AFIB.  Now feeling like heart is fluttering but still in rhythm. Does not drink at all because she is worried about trigger. She has not had a previous sleep study but she does not snore nor does she have daytime somnolence.  12/25/13-she has seen Dr. Rayann Heman in consultation who started her on flecainide 50 mg twice a day. She has had much less frequent bouts of atrial fibrillation. Her longest bout was approximately 12 hours which is much improved. No syncope, no bleeding, no orthopnea, no PND. EKG reassuring.  02/26/14 - Think she is having only minor symptoms or side effects of medication. On 100mg  BID for a while, backed off to 50 but had more AFIB. Went back to 100 BID. Feels a little sluggish.   10/30/14-still feeling sluggish/short of breath when walking up stairs but she is able to walk on flat ground without any difficulty. Walked over 6 miles a day in [place] without problems. Dr. Leonides Schanz was interested in starting statin medication however Stacy Silva has  hesitancies. She is concerned about potential side effects with her other medications.  Wt Readings from Last 3 Encounters:  10/30/14 181 lb (82.101 kg)  02/26/14 177 lb (80.287 kg)  12/25/13 178 lb (80.74 kg)     Past Medical History  Diagnosis Date  . Obesity   . DDD (degenerative disc disease)   . Myalgia and myositis, unspecified   . Insomnia   . Hyperlipidemia   . Glaucoma   . Paroxysmal atrial fibrillation   . Postmenopausal     Past Surgical History  Procedure Laterality Date  . Tonsillectomy    . Appendectomy    . Bunionectomy    . Laminectomy      Current Outpatient Prescriptions  Medication Sig Dispense Refill  . aspirin 325 MG tablet Take 325 mg by mouth daily.    Marland Kitchen diltiazem (CARDIZEM) 60 MG tablet Take 60 mg by mouth as needed.    Marland Kitchen ELIDEL 1 % cream Apply 1 application topically daily.    . flecainide (TAMBOCOR) 100 MG tablet Take 1 tablet (100 mg total) by mouth 2 (two) times daily. 180 tablet 3  . latanoprost (XALATAN) 0.005 % ophthalmic solution Place 1 drop into both eyes at bedtime.    . metoprolol succinate (TOPROL-XL) 50 MG 24 hr tablet take 1 and 1/2 tablets by mouth every morning and 2 tablets every evening 105 tablet 5  . Multiple Vitamin (MULTIVITAMIN) capsule Take 1 capsule by mouth daily.    . NON FORMULARY TAKE ONE  PILL OF PROTANDIM DAILY    . Omega-3 Fatty Acids ([employer] FISH OIL) 1000 MG CAPS TAKE 2 CAPSULES DAILY    . zolpidem (AMBIEN) 10 MG tablet Take 5 mg by mouth at bedtime as needed.  0   No current facility-administered medications for this visit.    Allergies:    Allergies  Allergen Reactions  . Amoxil [Amoxicillin]     SKIN RASH  . Erythromycin     CAUSE NAUSEA AND VOMITING  . Tape     RASH AND BLISTERS    Social History:  The patient  reports that she has quit smoking. She does not have any smokeless tobacco history on file. She reports that she drinks alcohol. She reports that she does not use illicit drugs.   ROS:  Please  see the history of present illness.   No bleeding, no syncope, no orthopnea, no PND  All other systems reviewed and negative.   PHYSICAL EXAM: VS:  BP 116/78 mmHg  Pulse 53  Ht 5\' 3"  (1.6 m)  Wt 181 lb (82.101 kg)  BMI 32.07 kg/m2 Well nourished, well developed, in no acute distress HEENT: normal Neck: no JVD Cardiac:  normal S1, S2; bradycardic but regular,no murmur Lungs:  clear to auscultation bilaterally, no wheezing, rhonchi or rales Abd: soft, nontender, no hepatomegaly Ext: no edema Skin: warm and dry Neuro: no focal abnormalities noted  EKG: 12/25/13- Sinus bradycardia rate 53, possible left atrial enlargement Echocardiogram: 10/2013- left atrial size 39 mm. Normal EF Nuclear stress test: 2012-low-risk, no ischemia, normal EF     ASSESSMENT AND PLAN:  1. Paroxysmal atrial fibrillation- flecainide 100 mg twice a day. EKG shows QRS duration of 84 ms. Sinus bradycardia 53 beats per minute. CHADS-VAS is 1 (Female), hence no anticoagulation currently. Left atrial size is normal. Nuclear stress test in 2012 with no ischemia.Exercise treadmill test reassuring on flecainide. Overall, she is very satisfied with the use of antiarrhythmic drugs. She states that she wishes to on these earlier. I'm requesting her lipids/lab work. 2. Fatigue-mild. Continue to exercise. Overall very impressed. She is to contact us if any worrisome symptoms develop. 3. We will see her back in 6 months.  Signed, Candee Furbish, MD [facility]  10/30/2014 11:26 AM

## 2014-10-31 ENCOUNTER — Encounter: Payer: Self-pay | Admitting: Internal Medicine

## 2014-11-30 ENCOUNTER — Ambulatory Visit (INDEPENDENT_AMBULATORY_CARE_PROVIDER_SITE_OTHER): Payer: BC Managed Care – PPO | Admitting: Physician Assistant

## 2014-11-30 ENCOUNTER — Telehealth: Payer: Self-pay | Admitting: Cardiology

## 2014-11-30 ENCOUNTER — Encounter: Payer: Self-pay | Admitting: Physician Assistant

## 2014-11-30 VITALS — BP 118/64 | HR 50 | Ht 63.0 in | Wt 181.0 lb

## 2014-11-30 DIAGNOSIS — E669 Obesity, unspecified: Secondary | ICD-10-CM

## 2014-11-30 DIAGNOSIS — R001 Bradycardia, unspecified: Secondary | ICD-10-CM

## 2014-11-30 DIAGNOSIS — R072 Precordial pain: Secondary | ICD-10-CM

## 2014-11-30 DIAGNOSIS — Z87891 Personal history of nicotine dependence: Secondary | ICD-10-CM

## 2014-11-30 DIAGNOSIS — I480 Paroxysmal atrial fibrillation: Secondary | ICD-10-CM | POA: Insufficient documentation

## 2014-11-30 DIAGNOSIS — E785 Hyperlipidemia, unspecified: Secondary | ICD-10-CM

## 2014-11-30 LAB — BASIC METABOLIC PANEL
BUN: 20 mg/dL (ref 6–23)
CALCIUM: 9.7 mg/dL (ref 8.4–10.5)
CO2: 27 meq/L (ref 19–32)
CREATININE: 0.83 mg/dL (ref 0.50–1.10)
Chloride: 105 mEq/L (ref 96–112)
Glucose, Bld: 103 mg/dL — ABNORMAL HIGH (ref 70–99)
Potassium: 4.1 mEq/L (ref 3.5–5.3)
SODIUM: 143 meq/L (ref 135–145)

## 2014-11-30 LAB — TSH: TSH: 0.902 u[IU]/mL (ref 0.350–4.500)

## 2014-11-30 LAB — CBC
HEMATOCRIT: 42 % (ref 36.0–46.0)
HEMOGLOBIN: 14.3 g/dL (ref 12.0–15.0)
MCH: 30.2 pg (ref 26.0–34.0)
MCHC: 34 g/dL (ref 30.0–36.0)
MCV: 88.6 fL (ref 78.0–100.0)
MPV: 9 fL — AB (ref 9.4–12.4)
Platelets: 345 10*3/uL (ref 150–400)
RBC: 4.74 MIL/uL (ref 3.87–5.11)
RDW: 13.2 % (ref 11.5–15.5)
WBC: 9 10*3/uL (ref 4.0–10.5)

## 2014-11-30 MED ORDER — OMEPRAZOLE 20 MG PO CPDR: 20.0000 mg | DELAYED_RELEASE_CAPSULE | Freq: Two times a day (BID) | ORAL | Status: DC

## 2014-11-30 NOTE — Telephone Encounter (Signed)
Talked to patient about her chest pain. Patient states "I have chest pain that comes and goes. I have some palpations and at times I feel a slight pressure and some burning. I don't feel like I need to go to the ER, but would like to come in and get an EKG." Patient has been having these symptoms off and on since last night. Patient's blood pressure is 114/58 and heart rate 50. Patient thinks she maybe in A. Fib. She took Diltiazem 60 mg last night with no significant improvement. Talked to Dr. Marlou Porch, DOD and the patient's primary cardiologist. He advised patient come in to see [facility], flex for the day. Patient is scheduled to come in to office at 2:30 pm this afternoon. Patient verbalized understanding of this appointment. Advised patient that if she felt worse to go to the ED, and also if she had any other questions to please call back.

## 2014-11-30 NOTE — Telephone Encounter (Signed)
Pt c/o of Chest Pain: 1. Are you having CP right now? yes 2. Are you experiencing any other symptoms (ex. SOB, nausea, vomiting, sweating)? no 3. How long have you been experiencing CP? Last night 4. Is your CP continuous or coming and going? Comes and goes 5. Have you taken Nitroglycerin? No   pt does not have any but has taken a diltiazem at 2am

## 2014-11-30 NOTE — Progress Notes (Signed)
[place], [place], [place]  [address] Phone: [phone] Fax:  [phone]  Date:  11/30/2014   Patient ID:  Stacy Silva, Stacy Silva 31-May-1956, MRN [id number]   PCP:  Cari Caraway, MD  Cardiologist:  Marlou Porch  Electrophysiologist:  Allred   History of Present Illness: Stacy Silva is a 58 y.o. female with history of PAF, sinus bardycardia, HLD (followed by PCP), obesity, former tobacco abuse (30 yrs) who presents as an add-on to flex clinic.   Per notes, she was diagnosed with AF in 2010 with HR up to 160 at that time, also sinus bradycardia with HR of 43 with no symptoms. She had a nonischemic nuc in 2012. Last echo: 10/2013 - EF 60-65%, no RWMA, LV diastolic function parameters were normal. She was previously seen by Dr. Rayann Heman in consultation who started her on flecainide with less frequent bouts of AF.  ETT 12/2013: exercised for 9:01, reached 82% of target, felt reassuring by Dr. Marlou Porch with no evidence of adverse arrhythmias of flecainide. She was feeling sluggish in 02/2014 - flecainide was decreased to 50mg  BID, but she had more AF so went back to 100mg  BID. She was doing well at 10/2014 OV. CHADSVASC = 1 for female only.  She comes in today with a 2-day history of smoldering chest pain. She says she's very sensitive to medicines and 2 days ago she developed an epigastric sensation, similar to the kind of stomach upset "that you get from a vitamin." She has found eating occasionally makes it better but at other times has made it worse. Tums has improved it at times but other times has made no difference. It is not made worse with exertion. She feels better when upright. She has a tendency to notice increased cardiac awareness when lying down to sleep at night, and last night she felt her heart contracting more forceful than usual. Pulse was not erratic or elevated. She felt increased chest discomfort at that time. She took a dilitazem and spent the rest of the night  sitting upward. Currently she just feels fatigued with a minimal low grade chest discomfort. Chest pain is not worse on inspiration. No recent travel, surgery, bedrest. VSS and EKG stable.  Recent Labs: No results found for requested labs within last 365 days.  Wt Readings from Last 3 Encounters:  11/30/14 181 lb (82.101 kg)  10/30/14 181 lb (82.101 kg)  02/26/14 177 lb (80.287 kg)     Past Medical History  Diagnosis Date  . Obesity   . DDD (degenerative disc disease)   . Myalgia and myositis, unspecified   . Insomnia   . Hyperlipidemia   . Glaucoma   . Paroxysmal atrial fibrillation   . Postmenopausal   . Sinus bradycardia     Current Outpatient Prescriptions  Medication Sig Dispense Refill  . aspirin 325 MG tablet Take 325 mg by mouth daily.    Marland Kitchen diltiazem (CARDIZEM) 60 MG tablet Take 60 mg by mouth as needed.    Marland Kitchen ELIDEL 1 % cream Apply 1 application topically daily.    . flecainide (TAMBOCOR) 100 MG tablet Take 1 tablet (100 mg total) by mouth 2 (two) times daily. 180 tablet 3  . latanoprost (XALATAN) 0.005 % ophthalmic solution Place 1 drop into both eyes at bedtime.    . metoprolol succinate (TOPROL-XL) 50 MG 24 hr tablet take 1 and 1/2 tablets by mouth every morning and 2 tablets every evening 105 tablet 5  . Multiple  Vitamin (MULTIVITAMIN) capsule Take 1 capsule by mouth daily.    . NON FORMULARY TAKE ONE PILL OF PROTANDIM DAILY    . Omega-3 Fatty Acids ([employer] FISH OIL) 1000 MG CAPS TAKE 2 CAPSULES DAILY    . zolpidem (AMBIEN) 10 MG tablet Take 5 mg by mouth at bedtime as needed.  0   No current facility-administered medications for this visit.    Allergies:   Amoxil; Erythromycin; and Tape   Social History:  The patient  reports that she has quit smoking. She does not have any smokeless tobacco history on file. She reports that she drinks alcohol. She reports that she does not use illicit drugs.   Family History:  The patient's family history includes CAD in her  father; Congestive Heart Failure in her father; Heart attack in her father, maternal grandfather, and paternal grandfather.   ROS:  Please see the history of present illness. Denies BRBPR, melena, hematmesis.   All other systems reviewed and negative.   PHYSICAL EXAM: VS:  BP 118/64 mmHg  Pulse 50  Ht 5\' 3"  (1.6 m)  Wt 181 lb (82.101 kg)  BMI 32.07 kg/m2 Well nourished, well developed WF, in no acute distress, comfortable appearing HEENT: normal Neck: no JVD, no carotid bruits Cardiac:  normal S1, S2; RRR; no murmur, no rub Lungs:  clear to auscultation bilaterally, no wheezing, rhonchi or rales Abd: soft, nontender, no hepatomegaly Ext: no edema Skin: warm and dry Neuro:  moves all extremities spontaneously, no focal abnormalities noted  EKG:  Sinus bradycardia 50bpm, TWI avL, V2, QTc 456ms - no change from prior  ASSESSMENT AND PLAN:  1. Chest pain - symptoms are somewhat atypical. EKG is nonacute. I suspect possible gastric irritation related to her aspirin vs GERD. Will check CBC to ensure no bleeding and start omeprazole 20mg  BID. Given nature of symptoms and the fact that she is on flecainide, will update her nuclear stress test. ER precautions reviewed. Patient in agreement with plan. Also d/w Dr. Marlou Porch. 2. PAF - maintaining NSR. Update BMET, TSH while on flecainide given recent "forceful" palpitations with normal rate. 3. H/o sinus bradycardia - this is her baseline.  4. Hyperlipidemia - the patient previously self d/c'd Lipitor due to congestion but later realized her daughter also had congestion. She is considering the idea of restarting. Lipids are followed by PCP. 5. Former tobacco abuse - congratulating her on maintaining cessation.  Dispo: F/u Dr. Marlou Porch or APP in 3 weeks.  Signed, Melina Copa, PA-C  11/30/2014 6:06 PM

## 2014-11-30 NOTE — Patient Instructions (Signed)
Your physician has recommended you make the following change in your medication:    START TAKING OMEPRAZOLE 20 TWICE A DAY    LABS TODAY BMET CBC AND TSH   Your physician has requested that you have a lexiscan myoview. For further information please visit [email or web address]. Please follow instruction sheet, as given.   Your physician recommends that you schedule a follow-up appointment in:  WITH DR Marlou Porch IN 3 WEEKS OR AVAIABLE APP

## 2014-12-03 ENCOUNTER — Telehealth: Payer: Self-pay | Admitting: Physician Assistant

## 2014-12-03 NOTE — Telephone Encounter (Signed)
New message        Pt returning carol's call

## 2014-12-03 NOTE — Telephone Encounter (Signed)
pt notified of lab results with verbal understanding 

## 2014-12-11 ENCOUNTER — Ambulatory Visit (HOSPITAL_COMMUNITY): Payer: BC Managed Care – PPO | Attending: Cardiovascular Disease | Admitting: Radiology

## 2014-12-11 DIAGNOSIS — R001 Bradycardia, unspecified: Secondary | ICD-10-CM

## 2014-12-11 DIAGNOSIS — I480 Paroxysmal atrial fibrillation: Secondary | ICD-10-CM

## 2014-12-11 DIAGNOSIS — R079 Chest pain, unspecified: Secondary | ICD-10-CM | POA: Insufficient documentation

## 2014-12-11 DIAGNOSIS — R072 Precordial pain: Secondary | ICD-10-CM

## 2014-12-11 DIAGNOSIS — E785 Hyperlipidemia, unspecified: Secondary | ICD-10-CM | POA: Diagnosis not present

## 2014-12-11 MED ORDER — TECHNETIUM TC 99M SESTAMIBI GENERIC - CARDIOLITE
30.0000 | Freq: Once | INTRAVENOUS | Status: AC | PRN
  Administered 2014-12-11: 30 via INTRAVENOUS

## 2014-12-11 MED ORDER — TECHNETIUM TC 99M SESTAMIBI GENERIC - CARDIOLITE
10.0000 | Freq: Once | INTRAVENOUS | Status: AC | PRN
  Administered 2014-12-11: 10 via INTRAVENOUS

## 2014-12-11 MED ORDER — REGADENOSON 0.4 MG/5ML IV SOLN
0.4000 mg | Freq: Once | INTRAVENOUS | Status: AC
  Administered 2014-12-11: 0.4 mg via INTRAVENOUS

## 2014-12-11 NOTE — Progress Notes (Signed)
[facility] NUCLE[facility] [address] [place], [place] [address] [phone]    Cardiology Nuclear Med Study  Stacy Silva is a 58 y.o. female     MRN : [id number]     DOB: [birth year]  Procedure Date: 12/11/2014  Nuclear Med Background Indication for Stress Test:  Evaluation for Ischemia History:  MPI 2012, Afib Cardiac Risk Factors: Lipids  Symptoms:  Chest Pain   Nuclear Pre-Procedure Caffeine/Decaff Intake:  None> 12 hrs NPO After: 8:30pm   Lungs:  clear O2 Sat: 97% on room air. IV 0.9% NS with Angio Cath:  22g  IV Site: R Antecubital x 1, tolerated well IV Started by:  Irven Baltimore, RN  Chest Size (in):  40 Cup Size: D  Height: 5\' 3"  (1.6 m)  Weight:  185 lb (83.915 kg)  BMI:  Body mass index is 32.78 kg/(m^2). Tech Comments:  Patient took Tambocor this am, and Toprol at 7:00pm yesterday. Irven Baltimore, RN.    Nuclear Med Study 1 or 2 day study: 1 day  Stress Test Type:  Treadmill/Lexiscan  Reading MD: N/A  Order Authorizing Provider:  Candee Furbish, MD, and Melina Copa, PAC  Resting Radionuclide: Technetium 70m Sestamibi  Resting Radionuclide Dose: 11.0 mCi   Stress Radionuclide:  Technetium 42m Sestamibi  Stress Radionuclide Dose: 33.0 mCi           Stress Protocol Rest HR: 52 Stress HR: 81  Rest BP: 132/83 Stress BP: 83/67  Exercise Time (min): n/a METS: n/a   Predicted Max HR: 161 bpm % Max HR: 50.31 bpm Rate Pressure Product: 10449   Dose of Adenosine (mg):  n/a Dose of Lexiscan: 0.4 mg  Dose of Atropine (mg): n/a Dose of Dobutamine: n/a mcg/kg/min (at max HR)  Stress Test Technologist: Glade Lloyd, BS-ES  Nuclear Technologist:  Annye Rusk, CNMT     Rest Procedure:  Myocardial perfusion imaging was performed at rest 45 minutes following the intravenous administration of Technetium 75m Sestamibi. Rest ECG: NSR - Normal EKG  Stress Procedure:  The patient received IV Lexiscan 0.4 mg over 15-seconds with concurrent low level  exercise and then Technetium 26m Sestamibi was injected at 30-seconds while the patient continued walking one more minute.  Quantitative spect images were obtained after a 45-minute delay.  During the infusion of Lexiscan the patient complained of SOB, chest tightness and lightheadedness.  These symptoms subsided in recovery.  Stress ECG: No significant change from baseline ECG  QPS Raw Data Images:  Normal; no motion artifact; normal heart/lung ratio. Stress Images:  Normal homogeneous uptake in all areas of the myocardium. Rest Images:  Normal homogeneous uptake in all areas of the myocardium. Subtraction (SDS):  No evidence of ischemia. Transient Ischemic Dilatation (Normal <1.22):  0.86 Lung/Heart Ratio (Normal <0.45):  0.40  Quantitative Gated Spect Images QGS EDV:  75 ml QGS ESV:  17 ml  Impression Exercise Capacity:  Lexiscan with low level exercise. BP Response:  Normal blood pressure response. Clinical Symptoms:  Mild chest pain/dyspnea. ECG Impression:  No significant ST segment change suggestive of ischemia. Comparison with Prior Nuclear Study: No previous nuclear study performed  Overall Impression:  Normal stress nuclear study.  LV Ejection Fraction: 77%.  LV Wall Motion:  NL LV Function; NL Wall Motion  Darlin Coco MD

## 2014-12-12 ENCOUNTER — Telehealth: Payer: Self-pay | Admitting: Cardiology

## 2014-12-12 NOTE — Telephone Encounter (Signed)
pt notified about myoview and to monitor BP. She states BP generally runs on the lower side and she does already check BP periodically. She has a f/u 1/8 w/Chris Merrilee Jansky, NP Pt said thank you for the good news.

## 2014-12-12 NOTE — Telephone Encounter (Signed)
Follow Up    Pt calling following up on results and call from earlier. Please call back.

## 2014-12-21 ENCOUNTER — Ambulatory Visit: Payer: BC Managed Care – PPO | Admitting: Nurse Practitioner

## 2015-01-04 ENCOUNTER — Ambulatory Visit: Payer: Self-pay | Admitting: Cardiology

## 2015-01-24 ENCOUNTER — Ambulatory Visit: Payer: Self-pay | Admitting: Cardiology

## 2015-01-26 ENCOUNTER — Other Ambulatory Visit: Payer: Self-pay | Admitting: Cardiology

## 2015-01-29 ENCOUNTER — Other Ambulatory Visit (HOSPITAL_COMMUNITY): Payer: Self-pay | Admitting: Family Medicine

## 2015-01-29 DIAGNOSIS — Z1231 Encounter for screening mammogram for malignant neoplasm of breast: Secondary | ICD-10-CM

## 2015-02-05 ENCOUNTER — Ambulatory Visit (HOSPITAL_COMMUNITY)
Admission: RE | Admit: 2015-02-05 | Discharge: 2015-02-05 | Disposition: A | Discharge status: Home / Self Care | Payer: BLUE CROSS/BLUE SHIELD | Source: Ambulatory Visit | Attending: Family Medicine | Admitting: Family Medicine

## 2015-02-05 DIAGNOSIS — Z1231 Encounter for screening mammogram for malignant neoplasm of breast: Secondary | ICD-10-CM | POA: Diagnosis not present

## 2015-02-18 ENCOUNTER — Ambulatory Visit (INDEPENDENT_AMBULATORY_CARE_PROVIDER_SITE_OTHER): Payer: BLUE CROSS/BLUE SHIELD | Admitting: Cardiology

## 2015-02-18 ENCOUNTER — Other Ambulatory Visit: Payer: Self-pay | Admitting: Neurosurgery

## 2015-02-18 ENCOUNTER — Encounter: Payer: Self-pay | Admitting: Cardiology

## 2015-02-18 VITALS — BP 108/72 | HR 55 | Ht 63.0 in | Wt 189.0 lb

## 2015-02-18 DIAGNOSIS — M5126 Other intervertebral disc displacement, lumbar region: Secondary | ICD-10-CM

## 2015-02-18 DIAGNOSIS — E669 Obesity, unspecified: Secondary | ICD-10-CM

## 2015-02-18 DIAGNOSIS — R001 Bradycardia, unspecified: Secondary | ICD-10-CM

## 2015-02-18 DIAGNOSIS — E785 Hyperlipidemia, unspecified: Secondary | ICD-10-CM

## 2015-02-18 DIAGNOSIS — I480 Paroxysmal atrial fibrillation: Secondary | ICD-10-CM

## 2015-02-18 NOTE — Patient Instructions (Signed)
The current medical regimen is effective;  continue present plan and medications.  Follow up in 1 year with Dr. Skains.  You will receive a letter in the mail 2 months before you are due.  Please call us when you receive this letter to schedule your follow up appointment.  Thank you for choosing [facility]!!     

## 2015-02-18 NOTE — Progress Notes (Signed)
[place], [place], [place]  [address] Phone: [phone] Fax:  [phone]  Date:  02/18/2015   Patient ID:  Stacy Silva, Stacy Silva 1955/12/16, MRN [id number]   PCP:  Cari Caraway, MD  Cardiologist:  Marlou Porch  Electrophysiologist:  Allred   History of Present Illness: Stacy Silva is a 59 y.o. female with history of PAF, sinus bardycardia, HLD (followed by PCP), obesity, former tobacco abuse (30 yrs) who had prior chest pain.  Previous visit was as followed: Per notes, she was diagnosed with AF in 2010 with HR up to 160 at that time, also sinus bradycardia with HR of 43 with no symptoms. She had a nonischemic nuc in 2012. Last echo: 10/2013 - EF 60-65%, no RWMA, LV diastolic function parameters were normal. She was previously seen by Dr. Rayann Heman in consultation who started her on flecainide with less frequent bouts of AF.  ETT 12/2013: exercised for 9:01, reached 82% of target, felt reassuring by Dr. Marlou Porch with no evidence of adverse arrhythmias of flecainide. She was feeling sluggish in 02/2014 - flecainide was decreased to 50mg  BID, but she had more AF so went back to 100mg  BID. She was doing well at 10/2014 OV. CHADSVASC = 1 for female only.  She comes in today with a 2-day history of smoldering chest pain. She says she's very sensitive to medicines and 2 days ago she developed an epigastric sensation, similar to the kind of stomach upset "that you get from a vitamin." She has found eating occasionally makes it better but at other times has made it worse. Tums has improved it at times but other times has made no difference. It is not made worse with exertion. She feels better when upright. She has a tendency to notice increased cardiac awareness when lying down to sleep at night, and last night she felt her heart contracting more forceful than usual. Pulse was not erratic or elevated. She felt increased chest discomfort at that time. She took a dilitazem and spent the rest  of the night sitting upward. Currently she just feels fatigued with a minimal low grade chest discomfort. Chest pain is not worse on inspiration. No recent travel, surgery, bedrest. VSS and EKG stable.  Overall she is feeling much better on the PPI.  Recent Labs: 11/30/2014: Creatinine 0.83; Hemoglobin 14.3; Potassium 4.1; TSH 0.902  Wt Readings from Last 3 Encounters:  02/18/15 189 lb (85.73 kg)  12/11/14 185 lb (83.915 kg)  11/30/14 181 lb (82.101 kg)     Past Medical History  Diagnosis Date  . Obesity   . DDD (degenerative disc disease)   . Myalgia and myositis, unspecified   . Insomnia   . Hyperlipidemia   . Glaucoma   . Paroxysmal atrial fibrillation   . Postmenopausal   . Sinus bradycardia     Current Outpatient Prescriptions  Medication Sig Dispense Refill  . aspirin 325 MG tablet Take 325 mg by mouth daily.    Marland Kitchen diltiazem (CARDIZEM) 60 MG tablet Take 60 mg by mouth as needed.    Marland Kitchen ELIDEL 1 % cream Apply 1 application topically daily.    . flecainide (TAMBOCOR) 100 MG tablet Take 1 tablet (100 mg total) by mouth 2 (two) times daily. 180 tablet 3  . latanoprost (XALATAN) 0.005 % ophthalmic solution Place 1 drop into both eyes at bedtime.    . metoprolol succinate (TOPROL-XL) 50 MG 24 hr tablet take 1 and 1/2 tablets by mouth every morning and  2 tablets every evening 105 tablet 5  . Multiple Vitamin (MULTIVITAMIN) capsule Take 1 capsule by mouth daily.    . NON FORMULARY TAKE ONE PILL OF PROTANDIM DAILY    . Omega-3 Fatty Acids ([employer] FISH OIL) 1000 MG CAPS TAKE 2 CAPSULES DAILY    . zolpidem (AMBIEN) 10 MG tablet Take 5 mg by mouth at bedtime as needed.  0   No current facility-administered medications for this visit.    Allergies:   Amoxil; Erythromycin; and Tape   Social History:  The patient  reports that she has quit smoking. She does not have any smokeless tobacco history on file. She reports that she drinks alcohol. She reports that she does not use illicit  drugs.   Family History:  The patient's family history includes CAD in her father; Congestive Heart Failure in her father; Heart attack in her father, maternal grandfather, and paternal grandfather.   ROS:  Please see the history of present illness. Denies BRBPR, melena, hematmesis.   All other systems reviewed and negative.   PHYSICAL EXAM: VS:  BP 108/72 mmHg  Pulse 55  Ht 5\' 3"  (1.6 m)  Wt 189 lb (85.73 kg)  BMI 33.49 kg/m2 Well nourished, well developed WF, in no acute distress, comfortable appearing HEENT: normal Neck: no JVD, no carotid bruits Cardiac:  normal S1, S2; RRR; no murmur, no rub Lungs:  clear to auscultation bilaterally, no wheezing, rhonchi or rales Abd: soft, nontender, no hepatomegaly Ext: no edema Skin: warm and dry Neuro:  moves all extremities spontaneously, no focal abnormalities noted  EKG:  Sinus bradycardia 50bpm, TWI avL, V2, QTc 435ms - no change from prior  ASSESSMENT AND PLAN:  1. Chest pain - GERD -like symptoms. She has tried ranitidine or Pepcid in the past, omeprazole 20mg  BID has worked. Nuclear stress test reassuring, low risk.  2. PAF - maintaining NSR. Very rare palpitations she knows to take diltiazem 60 if she is having an issue. She will lay down. Call us.. 3. H/o sinus bradycardia - this is her baseline.  4. Hyperlipidemia - the patient previously self d/c'd Lipitor due to congestion but later realized her daughter also had congestion. She is considering the idea of restarting. Lipids are followed by PCP. 5. Former tobacco abuse - congratulating her on maintaining cessation.  One-year follow-up  Signed, Candee Furbish, MD   02/18/2015 4:20 PM

## 2015-02-28 ENCOUNTER — Ambulatory Visit
Admission: RE | Admit: 2015-02-28 | Discharge: 2015-02-28 | Disposition: A | Discharge status: Home / Self Care | Payer: BLUE CROSS/BLUE SHIELD | Source: Ambulatory Visit | Attending: Neurosurgery | Admitting: Neurosurgery

## 2015-02-28 VITALS — BP 115/55 | HR 47

## 2015-02-28 DIAGNOSIS — M5126 Other intervertebral disc displacement, lumbar region: Secondary | ICD-10-CM

## 2015-02-28 DIAGNOSIS — M5127 Other intervertebral disc displacement, lumbosacral region: Secondary | ICD-10-CM

## 2015-02-28 MED ORDER — ONDANSETRON HCL 4 MG/2ML IJ SOLN
4.0000 mg | Freq: Once | INTRAMUSCULAR | Status: AC
Start: 2015-02-28 — End: 2015-02-28
  Administered 2015-02-28: 4 mg via INTRAMUSCULAR

## 2015-02-28 MED ORDER — ONDANSETRON HCL 4 MG/2ML IJ SOLN: 4.0000 mg | Freq: Four times a day (QID) | INTRAMUSCULAR | Status: DC | PRN

## 2015-02-28 MED ORDER — IOHEXOL 300 MG/ML  SOLN
10.0000 mL | Freq: Once | INTRAMUSCULAR | Status: AC | PRN
  Administered 2015-02-28: 10 mL via INTRATHECAL

## 2015-02-28 MED ORDER — DIAZEPAM 5 MG PO TABS
10.0000 mg | ORAL_TABLET | Freq: Once | ORAL | Status: AC
  Administered 2015-02-28: 10 mg via ORAL

## 2015-02-28 MED ORDER — MEPERIDINE HCL 100 MG/ML IJ SOLN
75.0000 mg | Freq: Once | INTRAMUSCULAR | Status: AC
  Administered 2015-02-28: 75 mg via INTRAMUSCULAR

## 2015-02-28 NOTE — Discharge Instructions (Signed)

## 2015-02-28 NOTE — Progress Notes (Signed)
Discharge instructions explained to pt. 

## 2015-04-07 ENCOUNTER — Other Ambulatory Visit: Payer: Self-pay | Admitting: Cardiology

## 2015-10-07 ENCOUNTER — Other Ambulatory Visit: Payer: Self-pay | Admitting: Cardiology

## 2016-01-21 ENCOUNTER — Other Ambulatory Visit: Payer: Self-pay

## 2016-01-21 DIAGNOSIS — Z1231 Encounter for screening mammogram for malignant neoplasm of breast: Secondary | ICD-10-CM

## 2016-02-03 ENCOUNTER — Other Ambulatory Visit: Payer: Self-pay | Admitting: Adult Health

## 2016-02-03 NOTE — Telephone Encounter (Signed)
Rx request sent to pharmacy.  

## 2016-02-10 ENCOUNTER — Ambulatory Visit
Admission: RE | Admit: 2016-02-10 | Discharge: 2016-02-10 | Disposition: A | Discharge status: Home / Self Care | Payer: BLUE CROSS/BLUE SHIELD | Source: Ambulatory Visit

## 2016-02-10 DIAGNOSIS — Z1231 Encounter for screening mammogram for malignant neoplasm of breast: Secondary | ICD-10-CM

## 2016-02-14 IMAGING — MG MM SCREENING BREAST TOMO BILAT
8 series · 8 of 24 positions shown · non-contrast
Comparison: Previous exam(s).

CLINICAL DATA: Screening.

EXAM:
DIGITAL SCREENING BILATERAL MAMMOGRAM WITH 3D TOMO WITH CAD

[R MLO]
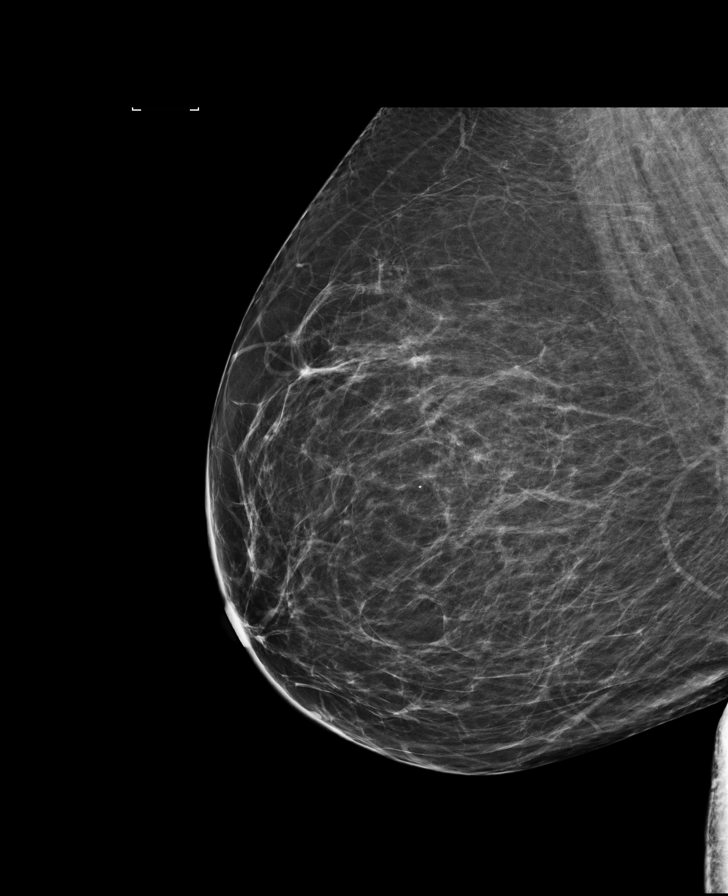

[L MLO]
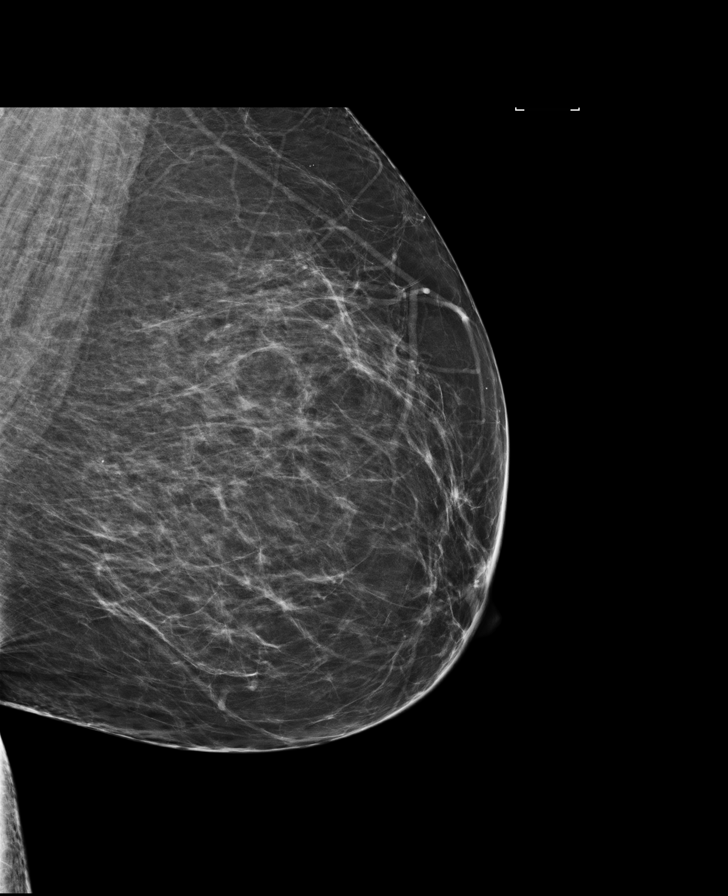

[L CC]
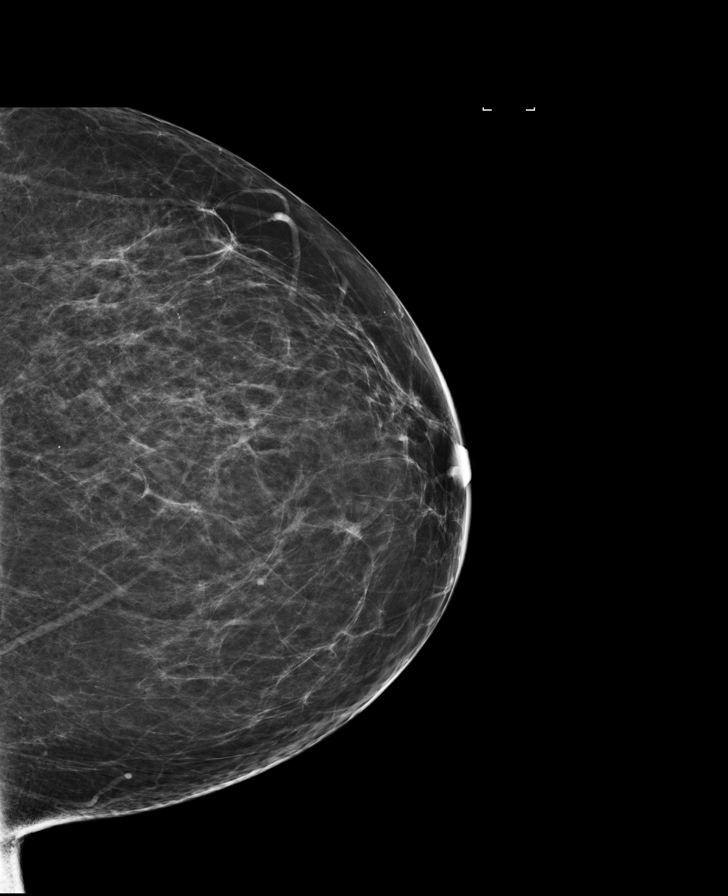

[R CC]
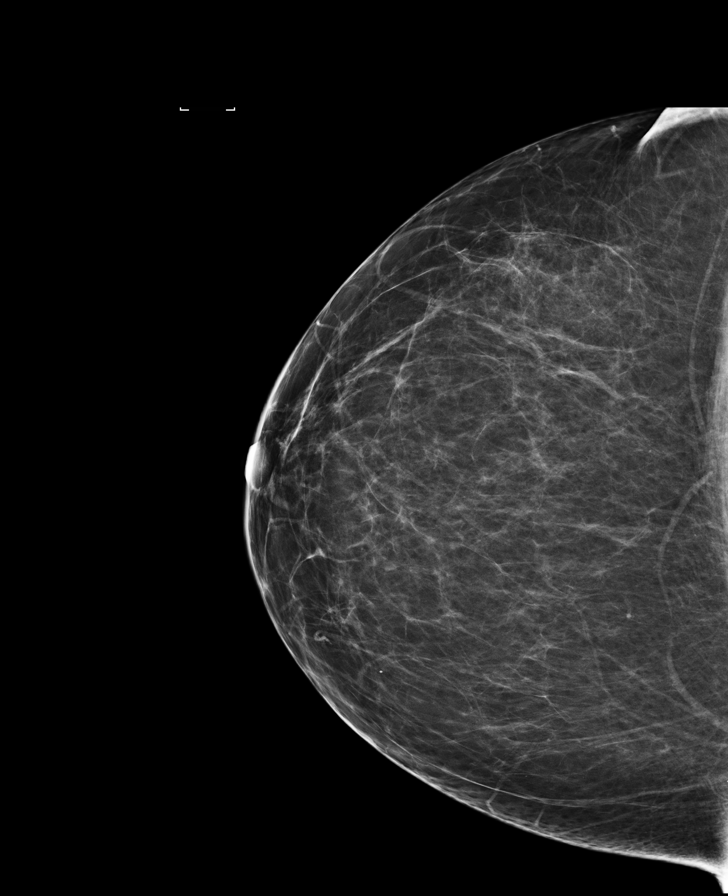

[L MLO tomo · tomo slice 39/78.0]
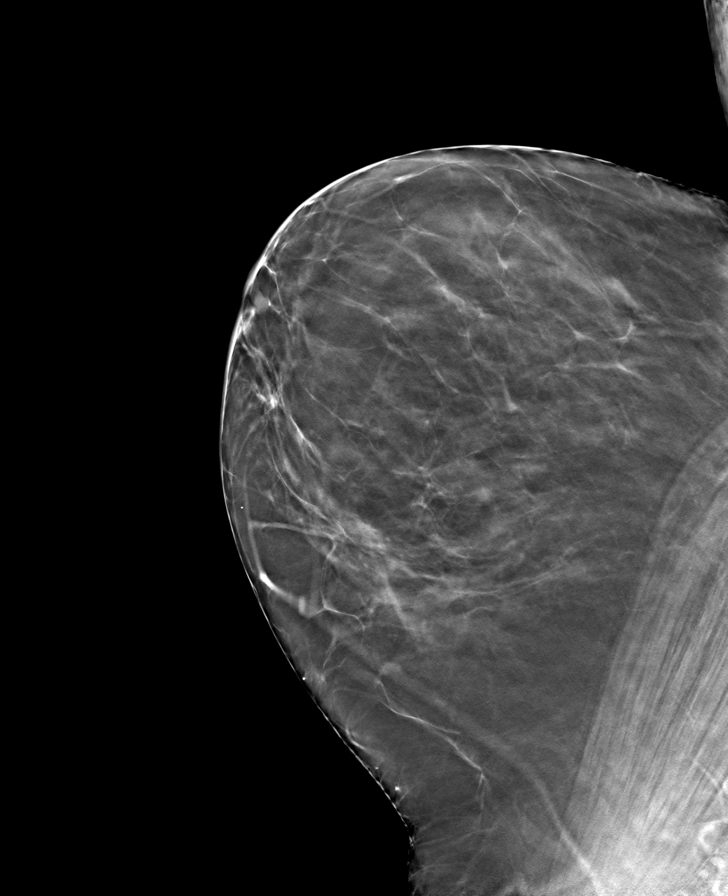

[L CC tomo · tomo slice 41/80.0]
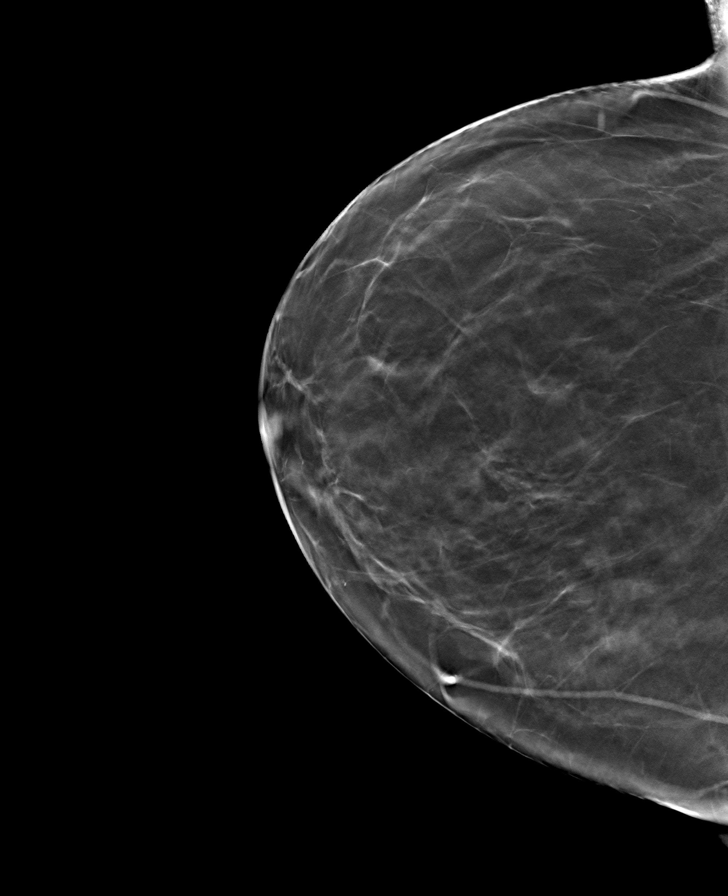

[R CC tomo · tomo slice 43/85.0]
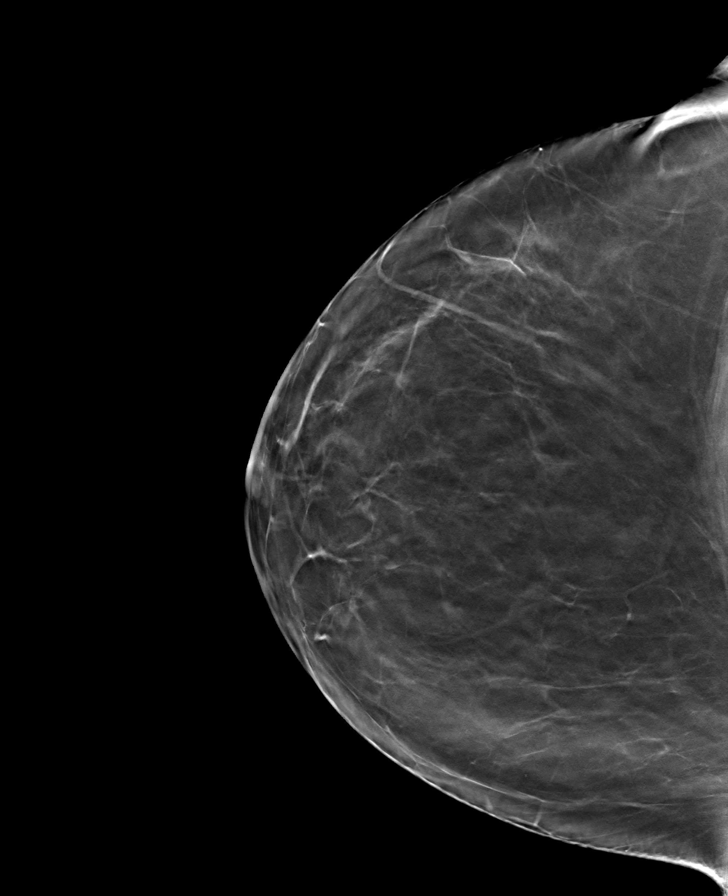

[R MLO tomo · tomo slice 41/80.0]
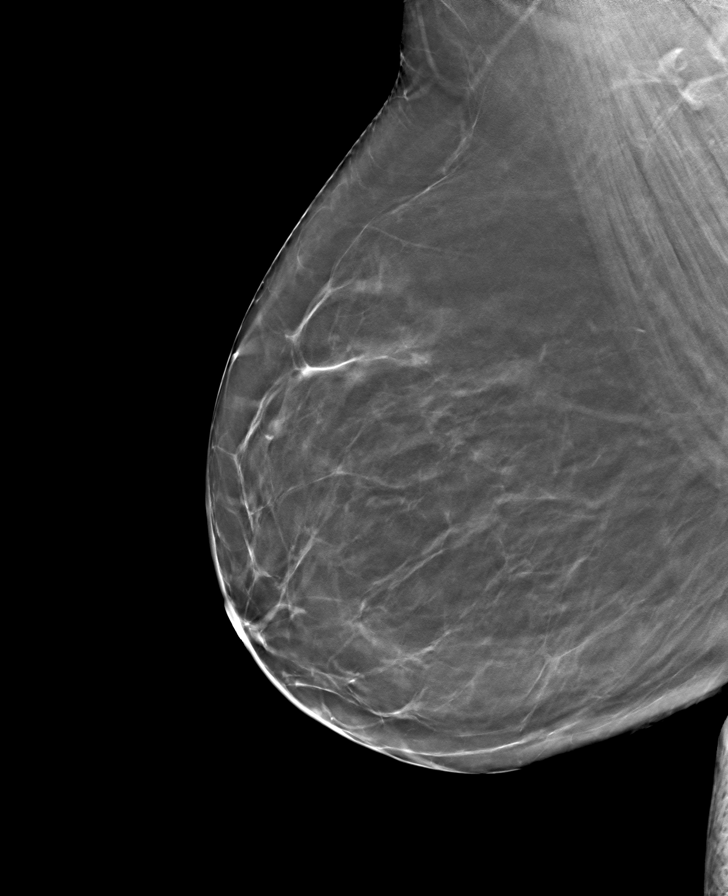

[8 of 24 positions shown; findings below may reference images not displayed]

ACR Breast Density Category b: There are scattered areas of
fibroglandular density.
FINDINGS: There are no findings suspicious for malignancy. Images were
processed with CAD.
IMPRESSION: No mammographic evidence of malignancy. A result letter of this
screening mammogram will be mailed directly to the patient.

RECOMMENDATION:
Screening mammogram in one year. (Code:55-L-23V)

BI-RADS CATEGORY  1: Negative.

## 2016-04-01 ENCOUNTER — Telehealth: Payer: Self-pay | Admitting: Cardiology

## 2016-04-01 ENCOUNTER — Ambulatory Visit (INDEPENDENT_AMBULATORY_CARE_PROVIDER_SITE_OTHER): Payer: BLUE CROSS/BLUE SHIELD | Admitting: Cardiology

## 2016-04-01 ENCOUNTER — Encounter: Payer: Self-pay | Admitting: Cardiology

## 2016-04-01 VITALS — BP 118/68 | HR 53 | Ht 63.0 in | Wt 184.2 lb

## 2016-04-01 DIAGNOSIS — R001 Bradycardia, unspecified: Secondary | ICD-10-CM | POA: Diagnosis not present

## 2016-04-01 DIAGNOSIS — E785 Hyperlipidemia, unspecified: Secondary | ICD-10-CM | POA: Diagnosis not present

## 2016-04-01 DIAGNOSIS — I480 Paroxysmal atrial fibrillation: Secondary | ICD-10-CM | POA: Diagnosis not present

## 2016-04-01 DIAGNOSIS — Z87891 Personal history of nicotine dependence: Secondary | ICD-10-CM

## 2016-04-01 MED ORDER — ASPIRIN 81 MG PO TABS: 81.0000 mg | ORAL_TABLET | Freq: Every day | ORAL | Status: DC

## 2016-04-01 MED ORDER — PRAVASTATIN SODIUM 10 MG PO TABS: 20.0000 mg | ORAL_TABLET | Freq: Every evening | ORAL | Status: DC

## 2016-04-01 MED ORDER — METOPROLOL SUCCINATE ER 50 MG PO TB24: 50.0000 mg | ORAL_TABLET | Freq: Two times a day (BID) | ORAL | Status: DC

## 2016-04-01 MED ORDER — PRAVASTATIN SODIUM 10 MG PO TABS: 10.0000 mg | ORAL_TABLET | Freq: Every evening | ORAL | Status: DC

## 2016-04-01 NOTE — Progress Notes (Signed)
[place], [place], [place]  [address] Phone: [phone] Fax:  [phone]  Date:  04/01/2016   Patient ID:  Stacy, Silva 07-04-1956, MRN [id number]   PCP:  Cari Caraway, MD  Cardiologist:  Marlou Porch  Electrophysiologist:  Allred   History of Present Illness: TIAIRA CROSTHWAITE is a 60 y.o. female with history of PAF on flecainide, sinus bardycardia, HLD (followed by PCP), obesity, former tobacco abuse (30 yrs) who had prior chest pain.  She is feeling overall quite well regarding her heart. No considerable palpitations. Tolerating medications well. She does however have chronic back pain and pain management with the neurosurgery clinic. She is excited about going to [facility].  Has had trouble in the past with statins, Crestor, Lipitor. Willing to try pravastatin.  Previous visit was as followed: Per notes, she was diagnosed with AF in 2010 with HR up to 160 at that time, also sinus bradycardia with HR of 43 with no symptoms. She had a nonischemic nuc in 2012. Last echo: 10/2013 - EF 60-65%, no RWMA, LV diastolic function parameters were normal. She was previously seen by Dr. Rayann Heman in consultation who started her on flecainide with less frequent bouts of AF.  ETT 12/2013: exercised for 9:01, reached 82% of target, felt reassuring by Dr. Marlou Porch with no evidence of adverse arrhythmias of flecainide. She was feeling sluggish in 02/2014 - flecainide was decreased to 50mg  BID, but she had more AF so went back to 100mg  BID. She was doing well at 10/2014 OV. CHADSVASC = 1 for female only.     Recent Labs: No results found for requested labs within last 365 days.  Wt Readings from Last 3 Encounters:  04/01/16 184 lb 3.2 oz (83.553 kg)  02/18/15 189 lb (85.73 kg)  12/11/14 185 lb (83.915 kg)     Past Medical History  Diagnosis Date  . Obesity   . DDD (degenerative disc disease)   . Myalgia and myositis, unspecified   . Insomnia   . Hyperlipidemia     . Glaucoma   . Paroxysmal atrial fibrillation (HCC)   . Postmenopausal   . Sinus bradycardia     Current Outpatient Prescriptions  Medication Sig Dispense Refill  . aspirin 325 MG tablet Take 325 mg by mouth daily.    Marland Kitchen diltiazem (CARDIZEM) 60 MG tablet Take 60 mg by mouth as needed.    Marland Kitchen ELIDEL 1 % cream Apply 1 application topically daily.    . flecainide (TAMBOCOR) 100 MG tablet Take 1 tablet (100 mg total) by mouth 2 (two) times daily. 180 tablet 3  . latanoprost (XALATAN) 0.005 % ophthalmic solution Place 1 drop into both eyes at bedtime.    . metoprolol succinate (TOPROL-XL) 50 MG 24 hr tablet take 1 and 1/2 tablets by mouth every morning and 2 tablets every evening 105 tablet 5  . Multiple Vitamin (MULTIVITAMIN) capsule Take 1 capsule by mouth daily.    . NON FORMULARY TAKE ONE PILL OF PROTANDIM DAILY    . Omega-3 Fatty Acids (CVS FISH OIL) 1000 MG CAPS TAKE 2 CAPSULES DAILY    . zolpidem (AMBIEN) 10 MG tablet Take 5 mg by mouth at bedtime as needed.  0   No current facility-administered medications for this visit.    Allergies:   Erythromycin; Amoxil; and Tape   Social History:  The patient  reports that she has quit smoking. She does not have any smokeless tobacco history on file. She  reports that she drinks alcohol. She reports that she does not use illicit drugs.   Family History:  The patient's family history includes CAD in her father; Congestive Heart Failure in her father; Heart attack in her father, maternal grandfather, and paternal grandfather.   ROS:  Please see the history of present illness. Denies BRBPR, melena, hematmesis.   All other systems reviewed and negative.   PHYSICAL EXAM: VS:  BP 118/68 mmHg  Pulse 53  Ht 5\' 3"  (1.6 m)  Wt 184 lb 3.2 oz (83.553 kg)  BMI 32.64 kg/m2 Well nourished, well developed WF, in no acute distress, comfortable appearing HEENT: normal Neck: no JVD, no carotid bruits Cardiac:  normal S1, S2; Brady RR; no murmur, no  rub Lungs:  clear to auscultation bilaterally, no wheezing, rhonchi or rales Abd: soft, nontender, no hepatomegaly Ext: no edema Skin: warm and dry Neuro:  moves all extremities spontaneously, no focal abnormalities noted  EKG:  EKG ordered today. 04/01/16-sinus bradycardia rate 53, QRS duration 86, nonspecific ST-T wave changes. Personally viewed.-Prior Sinus bradycardia 50bpm, TWI avL, V2, QTc 449ms - no change from prior  ASSESSMENT AND PLAN:  1. Chest pain - resolved, GERD -like symptoms. She has tried ranitidine or Pepcid in the past, omeprazole 20mg  BID has worked. Nuclear stress test reassuring, low risk.  2. PAF - maintaining NSR/sinus bradycardia-because of bradycardia heart rate 53 now and 50 previously, I will cut back on her metoprolol to 50 mg twice a day. She was previously taking 75 mg in the morning and 100 mg in the evening. She knows to take diltiazem 60 if she is having an issue. She will lay down. Call us. Continue with flecainide 100 mg twice a day. 3. H/o sinus bradycardia - this is her baseline, however I will reduce her overall metoprolol dose to see if this helps..  4. Hyperlipidemia - previously has had described statin intolerance to Crestor as well as Lipitor. She is willing to try once again. We will start her on pravastatin 10 mg once a day. She will be seeing Dr. Leonides Schanz in one month. At that time, she can check her cholesterol as well as increase dose if necessary. Hopefully she will be able to tolerate this medication well. 5. Former tobacco abuse - congratulating her on maintaining cessation.  One-year follow-up  Signed, Candee Furbish, MD   04/01/2016 12:39 PM

## 2016-04-01 NOTE — Telephone Encounter (Signed)
Pt aware should be 10 mg one tablet daily.  New RX sent into pharmacy.  Medication list corrected.

## 2016-04-01 NOTE — Patient Instructions (Signed)
Medication Instructions:  Please decrease your Metoprolol to 50 mg twice a day, decrease ASA to 81 mg a day and start Pravastatin 10 mg a day. Continue all other medications as listed.  Follow-Up: Follow up in 1 year with Dr. Marlou Porch.  You will receive a letter in the mail 2 months before you are due.  Please call us when you receive this letter to schedule your follow up appointment.  If you need a refill on your cardiac medications before your next appointment, please call your pharmacy.  Thank you for choosing [facility]!!

## 2016-04-01 NOTE — Telephone Encounter (Signed)
Stacy Silva is calling because Dr.Skains prescribe her Pravastatin 10mg   tablet take 2 by mouth every evening . But because she is not sure how she will tolerate the medication , because she usually dont do well with the statin medication. She needs the instructions to say take 1 by mouth every evening . Please call   Thanks

## 2016-04-09 ENCOUNTER — Telehealth: Payer: Self-pay | Admitting: Cardiology

## 2016-04-09 NOTE — Telephone Encounter (Signed)
New message      Pt c/o medication issue:  1. Name of Medication: metoprolol  2. How are you currently taking this medication (dosage and times per day)? 50mg  in am and 50mg  in pm 3. Are you having a reaction (difficulty breathing--STAT)? no 4. What is your medication issue?  Pt states since decreasing medication, she went in afib.  She want to return to taking in 75mg  in am and 100mg  in pm. Please call

## 2016-04-09 NOTE — Telephone Encounter (Signed)
Patient would like to return to old dosing of Metoprolol that was recently decreased secondary to bradycardia. Currently taking Metoprolol 50 mg BID and Flecainide 100 mg BID. She would like to return to Metoprolol 75 mg in the a.m. and 100 mg in the p.m. States she did not have any AFib when she was on previous Metoprolol dose and Flecainide.  She is not symptomatic with bradycardia and that 50-60s HR is normal for her. She is not interesting in taking an extra dose of Metoprolol when she has an episode. She stresses she would like to go back to what was working. She is aware Dr. Kingsley Plan is in the hospital today and will forward message for him to address. Informed that we will call her back with his recommendations and she is agreeable to plan.  (If she is able to return to old dosing she needs new rx sent to her pharmacy)

## 2016-04-10 MED ORDER — METOPROLOL SUCCINATE ER 50 MG PO TB24: ORAL_TABLET | ORAL | Status: DC

## 2016-04-10 NOTE — Telephone Encounter (Signed)
This is fine.  Candee Furbish, MD

## 2016-04-10 NOTE — Telephone Encounter (Signed)
Pt aware OK with Dr Marlou Porch to return to her previous dose of Metoprolol 75 Q AM and 100 mg Q PM.  She would like a 90 day supply sent into [facility].

## 2016-04-28 ENCOUNTER — Other Ambulatory Visit (HOSPITAL_COMMUNITY)
Admission: RE | Admit: 2016-04-28 | Discharge: 2016-04-28 | Disposition: A | Discharge status: Home / Self Care | Payer: BLUE CROSS/BLUE SHIELD | Source: Ambulatory Visit | Attending: Family Medicine | Admitting: Family Medicine

## 2016-04-28 ENCOUNTER — Other Ambulatory Visit: Payer: Self-pay | Admitting: Family Medicine

## 2016-04-28 DIAGNOSIS — Z124 Encounter for screening for malignant neoplasm of cervix: Secondary | ICD-10-CM | POA: Diagnosis not present

## 2016-04-28 DIAGNOSIS — Z0000 Encounter for general adult medical examination without abnormal findings: Secondary | ICD-10-CM | POA: Diagnosis not present

## 2016-04-28 DIAGNOSIS — R7301 Impaired fasting glucose: Secondary | ICD-10-CM | POA: Diagnosis not present

## 2016-04-28 DIAGNOSIS — E782 Mixed hyperlipidemia: Secondary | ICD-10-CM | POA: Diagnosis not present

## 2016-04-29 LAB — CYTOLOGY - PAP

## 2016-05-04 DIAGNOSIS — M5442 Lumbago with sciatica, left side: Secondary | ICD-10-CM | POA: Diagnosis not present

## 2016-05-05 ENCOUNTER — Ambulatory Visit (HOSPITAL_COMMUNITY)
Admission: RE | Admit: 2016-05-05 | Discharge: 2016-05-05 | Disposition: A | Discharge status: Home / Self Care | Payer: BLUE CROSS/BLUE SHIELD | Source: Ambulatory Visit | Attending: Nurse Practitioner | Admitting: Nurse Practitioner

## 2016-05-05 ENCOUNTER — Other Ambulatory Visit: Payer: Self-pay

## 2016-05-05 ENCOUNTER — Telehealth: Payer: Self-pay | Admitting: Cardiology

## 2016-05-05 ENCOUNTER — Encounter (HOSPITAL_COMMUNITY): Payer: Self-pay | Admitting: Nurse Practitioner

## 2016-05-05 VITALS — BP 118/70 | HR 62 | Ht 63.0 in | Wt 188.2 lb

## 2016-05-05 DIAGNOSIS — Z8249 Family history of ischemic heart disease and other diseases of the circulatory system: Secondary | ICD-10-CM | POA: Diagnosis not present

## 2016-05-05 DIAGNOSIS — H409 Unspecified glaucoma: Secondary | ICD-10-CM | POA: Insufficient documentation

## 2016-05-05 DIAGNOSIS — E669 Obesity, unspecified: Secondary | ICD-10-CM | POA: Diagnosis not present

## 2016-05-05 DIAGNOSIS — E785 Hyperlipidemia, unspecified: Secondary | ICD-10-CM | POA: Insufficient documentation

## 2016-05-05 DIAGNOSIS — Z79899 Other long term (current) drug therapy: Secondary | ICD-10-CM | POA: Insufficient documentation

## 2016-05-05 DIAGNOSIS — Z87891 Personal history of nicotine dependence: Secondary | ICD-10-CM | POA: Diagnosis not present

## 2016-05-05 DIAGNOSIS — I480 Paroxysmal atrial fibrillation: Secondary | ICD-10-CM | POA: Diagnosis not present

## 2016-05-05 DIAGNOSIS — Z7982 Long term (current) use of aspirin: Secondary | ICD-10-CM | POA: Diagnosis not present

## 2016-05-05 DIAGNOSIS — Z888 Allergy status to other drugs, medicaments and biological substances status: Secondary | ICD-10-CM | POA: Diagnosis not present

## 2016-05-05 NOTE — Telephone Encounter (Signed)
New Message  Pt called states that she has had an increase in Afib request a call back to discuss if her medications will need to be adjusted. Pt request a call back to discuss. Also made an appt with Dr. Marlou Porch for 05/07/2016 please discuss if the appt is needed.

## 2016-05-05 NOTE — Progress Notes (Signed)
Patient ID: Stacy Silva, female   DOB: May 05, 1956, 60 y.o.   MRN: [id number]     Primary Care Physician: Cari Caraway, MD Referring Physician: Dr. Delorse Lek LAILANY [name] is a 60 y.o. female with a h/o PAF that has been doing well on flecainide for several years and has been keeping the pt in rhythm until recently. She has noticed several breakthrough episodes recently which usually respond to prn cardizem but cannot tolerate daily cardizem. She has a chasvasc score of 1 and is on a daily asa 325 mg. Pt wants to discuss means of being able to stay in SR. She does not drink alcohol or smoke. No sleep apnea, or high caffeine use. She did get an cortisone shot in her back yesterday and started with afib 12 hours yesterday, now back in rhythm with prn Cardizem.  Today, she denies symptoms of palpitations, chest pain, shortness of breath, orthopnea, PND, lower extremity edema, dizziness, presyncope, syncope, or neurologic sequela. The patient is tolerating medications without difficulties and is otherwise without complaint today.   Past Medical History  Diagnosis Date  . Obesity   . DDD (degenerative disc disease)   . Myalgia and myositis, unspecified   . Insomnia   . Hyperlipidemia   . Glaucoma   . Paroxysmal atrial fibrillation (HCC)   . Postmenopausal   . Sinus bradycardia    Past Surgical History  Procedure Laterality Date  . Tonsillectomy    . Appendectomy    . Bunionectomy    . Laminectomy      Current Outpatient Prescriptions  Medication Sig Dispense Refill  . aspirin 325 MG tablet Take 325 mg by mouth daily.    . diazepam (VALIUM) 10 MG tablet Take 5 mg by mouth as needed for anxiety.   0  . diltiazem (CARDIZEM) 60 MG tablet Take 60 mg by mouth as needed.    Marland Kitchen ELIDEL 1 % cream Apply 1 application topically daily.    . flecainide (TAMBOCOR) 100 MG tablet take 1 tablet by mouth twice a day 180 tablet 0  . HYDROcodone-acetaminophen (NORCO/VICODIN) 5-325 MG per  tablet Take 1-2 tablets by mouth every 6 (six) hours as needed for moderate pain.   0  . latanoprost (XALATAN) 0.005 % ophthalmic solution Place 1 drop into both eyes at bedtime.    . metoprolol succinate (TOPROL-XL) 50 MG 24 hr tablet Take 1 and 1/2 tablet every AM and 2 tablets every PM 315 tablet 3  . Multiple Vitamin (MULTIVITAMIN) capsule Take 1 capsule by mouth daily.    . NON FORMULARY TAKE ONE PILL OF PROTANDIM DAILY    . Omega-3 Fatty Acids ([employer] FISH OIL) 1000 MG CAPS TAKE 2 CAPSULES DAILY    . omeprazole (PRILOSEC) 20 MG capsule Take 1 capsule (20 mg total) by mouth 2 (two) times daily before a meal. 600 capsule 0  . ondansetron (ZOFRAN) 8 MG tablet Take 8 mg by mouth as needed for nausea (1/2 hour before pain meds).    . zolpidem (AMBIEN) 10 MG tablet Take 5 mg by mouth at bedtime as needed.  0   No current facility-administered medications for this encounter.    Allergies  Allergen Reactions  . Erythromycin Nausea And Vomiting  . Amoxil [Amoxicillin] Rash  . Tape Rash and Other (See Comments)    Blisters, too    Social History   Social History  . Marital Status: Married    Spouse Name: N/A  . Number of Children: N/A  .  Years of Education: N/A   Occupational History  . Not on file.   Social History Main Topics  . Smoking status: Former Research scientist (life sciences)  . Smokeless tobacco: Not on file     Comment: PATIENT QUIT ON 10/ 26/2010. 30 yr history  . Alcohol Use: 0.0 oz/week    0 Standard drinks or equivalent per week     Comment: rare  . Drug Use: No  . Sexual Activity: Not on file   Other Topics Concern  . Not on file   Social History Narrative   Pt lives in [place] with spouse.  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 Heart attack Maternal Grandfather     DECEASED  . Congestive Heart Failure Father     DECEASD  . Heart attack Father     DECEASED  . Heart attack Paternal Grandfather      DECEASED  . CAD Father     CABG age 13    ROS- All systems are reviewed and negative except as per the HPI above  Physical Exam: Filed Vitals:   05/05/16 1459  BP: 118/70  Pulse: 62  Height: 5\' 3"  (1.6 m)  Weight: 188 lb 3.2 oz (85.367 kg)    GEN- The patient is well appearing, alert and oriented x 3 today.   Head- normocephalic, atraumatic Eyes-  Sclera clear, conjunctiva pink Ears- hearing intact Oropharynx- clear Neck- supple, no JVP Lymph- no cervical lymphadenopathy Lungs- Clear to ausculation bilaterally, normal work of breathing Heart- Regular rate and rhythm, no murmurs, rubs or gallops, PMI not laterally displaced GI- soft, NT, ND, + BS Extremities- no clubbing, cyanosis, or edema MS- no significant deformity or atrophy Skin- no rash or lesion Psych- euthymic mood, full affect Neuro- strength and sensation are intact  EKG-NSR at 62 bpm, Pr int 174 ms, Qrs int 72 ms, QTc 458 ms Epic records reviewed   Assessment and Plan: 1. PAF Failing flecainide Options discussed, pt does not want to consider amiodarone, would consider increasing dose of flecainide and will obtain flecainide level today She is not that interested at this point re sotalol or Tikosyn but more willing to consider ablation Will get UTD echo, last one in 2014 showed normal structure.  She has been given a f/u appointment with Dr. Rayann Heman 6-5 to further discuss options For now continue flecainide/BB/prn CCB and asa for chadsvasc score of 1.  Geroge Baseman Ivory Maduro, [facility] [address] [place], [place] [address] [phone]

## 2016-05-05 NOTE — Telephone Encounter (Signed)
Pt called today as she has been having increased episodes of a. Fib over the past few weeks last a few hours. The pt stated that last night it started and she finally was able to rest about 1 am but she was still in a. Fib this morning when she woke up at 7AM. Pt stated it has been over 12 hours and she took her Diltiazem last night and again this morning, without relief. Pt has returned to her original dose of Metoprolol of 75 mg in the morning and 100 in the pm and has taken all medications as ordered. Pt has occasional CP with her episodes of a. Fib but not with this one, just the fluttery feeling and occasional SOB. She wants to know what she should do next. Pt concerns reviewed with DOD, [place], recommended that pt go to A. Fib clinic to be evaluated.  Pt setup to go to A. Fib clinic today at 2:30pm; pt agreed with plan, no additional questions at this time.

## 2016-05-06 ENCOUNTER — Ambulatory Visit (HOSPITAL_COMMUNITY)
Admission: RE | Admit: 2016-05-06 | Discharge: 2016-05-06 | Disposition: A | Discharge status: Home / Self Care | Payer: BLUE CROSS/BLUE SHIELD | Source: Ambulatory Visit | Attending: Nurse Practitioner | Admitting: Nurse Practitioner

## 2016-05-06 DIAGNOSIS — I480 Paroxysmal atrial fibrillation: Secondary | ICD-10-CM | POA: Insufficient documentation

## 2016-05-06 DIAGNOSIS — Z87891 Personal history of nicotine dependence: Secondary | ICD-10-CM | POA: Insufficient documentation

## 2016-05-06 DIAGNOSIS — I517 Cardiomegaly: Secondary | ICD-10-CM | POA: Diagnosis not present

## 2016-05-06 DIAGNOSIS — E785 Hyperlipidemia, unspecified: Secondary | ICD-10-CM | POA: Insufficient documentation

## 2016-05-06 DIAGNOSIS — I340 Nonrheumatic mitral (valve) insufficiency: Secondary | ICD-10-CM | POA: Diagnosis not present

## 2016-05-06 DIAGNOSIS — I4891 Unspecified atrial fibrillation: Secondary | ICD-10-CM | POA: Diagnosis present

## 2016-05-06 NOTE — Progress Notes (Signed)
*  PRELIMINARY RESULTS* Echocardiogram 2D Echocardiogram has been performed.  Leavy Cella 05/06/2016, 2:56 PM

## 2016-05-07 ENCOUNTER — Ambulatory Visit: Payer: BLUE CROSS/BLUE SHIELD | Admitting: Cardiology

## 2016-05-07 LAB — FLECAINIDE LEVEL: FLECAINIDE: 0.44 ug/mL (ref 0.20–1.00)

## 2016-05-14 ENCOUNTER — Other Ambulatory Visit: Payer: Self-pay | Admitting: *Deleted

## 2016-05-14 MED ORDER — FLECAINIDE ACETATE 100 MG PO TABS: 100.0000 mg | ORAL_TABLET | Freq: Two times a day (BID) | ORAL | Status: DC

## 2016-05-15 ENCOUNTER — Other Ambulatory Visit: Payer: Self-pay

## 2016-05-18 ENCOUNTER — Ambulatory Visit (INDEPENDENT_AMBULATORY_CARE_PROVIDER_SITE_OTHER): Payer: BLUE CROSS/BLUE SHIELD | Admitting: Internal Medicine

## 2016-05-18 ENCOUNTER — Encounter: Payer: Self-pay | Admitting: Internal Medicine

## 2016-05-18 VITALS — BP 124/86 | HR 49 | Ht 63.0 in | Wt 183.8 lb

## 2016-05-18 DIAGNOSIS — E669 Obesity, unspecified: Secondary | ICD-10-CM

## 2016-05-18 DIAGNOSIS — R001 Bradycardia, unspecified: Secondary | ICD-10-CM

## 2016-05-18 DIAGNOSIS — I480 Paroxysmal atrial fibrillation: Secondary | ICD-10-CM | POA: Diagnosis not present

## 2016-05-18 MED ORDER — RIVAROXABAN 20 MG PO TABS: 20.0000 mg | ORAL_TABLET | Freq: Every day | ORAL | Status: DC

## 2016-05-18 MED ORDER — METOPROLOL SUCCINATE ER 50 MG PO TB24: ORAL_TABLET | ORAL | Status: DC

## 2016-05-18 MED ORDER — DILTIAZEM HCL 60 MG PO TABS: 60.0000 mg | ORAL_TABLET | Freq: Every day | ORAL | Status: AC | PRN

## 2016-05-18 NOTE — Patient Instructions (Addendum)
Medication Instructions:  Your physician has recommended you make the following change in your medication:  1) Stop Aspirin 2) Start Xarelto 20 mg with supper   Labwork:  Your physician recommends that you return for lab work 06/10/16 at 1pm BMP/CBC--do not have to fast   Testing/Procedures: Your physician has requested that you have cardiac CT. Cardiac computed tomography (CT) is a painless test that uses an x-ray machine to take clear, detailed pictures of your heart. For further information please visit [email or web address]. Please follow instruction sheet as given.  Week of 06/15/16 ---one week prior to ablation   Your physician has recommended that you have an ablation. Catheter ablation is a medical procedure used to treat some cardiac arrhythmias (irregular heartbeats). During catheter ablation, a long, thin, flexible tube is put into a blood vessel in your groin (upper thigh), or neck. This tube is called an ablation catheter. It is then guided to your heart through the blood vessel. Radio frequency waves destroy small areas of heart tissue where abnormal heartbeats may cause an arrhythmia to start. Please see the instruction sheet given to you today.--06/23/16   Please arrive at the [facility] on 06/23/16 at 5:30am Do not eat or drink after midnight  Do not take any medications the morning of your procedure      Follow-Up: Your physician recommends that you schedule a follow-up appointment in: 4 weeks form 06/23/16 with Roderic Palau, NP and 3 months from 06/23/16 with Dr Rayann Heman   Any Other Special Instructions Will Be Listed Below (If Applicable).     If you need a refill on your cardiac medications before your next appointment, please call your pharmacy.

## 2016-05-18 NOTE — Progress Notes (Signed)
Primary Care Physician: Cari Caraway, MD Referring Physician:  Dr Marlou Porch   Stacy Silva is a 60 y.o. female with a h/o paroxysmal atrial fibrillation who presents today for EP follow-up.  She was initially diagnosed with atrial fibrillation 09/2009 after presenting to [facility] with symptoms of palpitations and dizziness.  She was confirmed to have afib.  She was evaluated by me in 2014 and initiated on flecainide.  She did very well with flecainide until recently.  She has recently had afib several times per week, lasting up to 12 hours.  She is unaware of triggers of episodes.  Sinus bradycardia has limited therapy.  Today, she denies symptoms of shortness of breath, orthopnea, PND, lower extremity edema, syncope, or neurologic sequela.  She has had several brief presyncopal events when she converts from afib to sinus though this is infrequent. The patient is tolerating medications without difficulties and is otherwise without complaint today.   Past Medical History  Diagnosis Date  . Obesity   . DDD (degenerative disc disease)   . Myalgia and myositis, unspecified   . Insomnia   . Hyperlipidemia   . Glaucoma   . Paroxysmal atrial fibrillation (HCC)   . Postmenopausal   . Sinus bradycardia    Past Surgical History  Procedure Laterality Date  . Tonsillectomy    . Appendectomy    . Bunionectomy    . Laminectomy      Current Outpatient Prescriptions  Medication Sig Dispense Refill  . diazepam (VALIUM) 10 MG tablet Take 5 mg by mouth daily as needed for anxiety.   0  . diltiazem (CARDIZEM) 60 MG tablet Take 1 tablet (60 mg total) by mouth daily as needed (heart rate). 30 tablet 3  . ELIDEL 1 % cream Apply 1 application topically daily.    . flecainide (TAMBOCOR) 100 MG tablet Take 1 tablet (100 mg total) by mouth 2 (two) times daily. 180 tablet 2  . HYDROcodone-acetaminophen (NORCO/VICODIN) 5-325 MG per tablet Take 1-2 tablets by mouth every 6 (six) hours as needed for  moderate pain.   0  . latanoprost (XALATAN) 0.005 % ophthalmic solution Place 1 drop into both eyes at bedtime.    . metoprolol succinate (TOPROL-XL) 50 MG 24 hr tablet Take 2 tablets by mouth in the AM and 2 1/2tablets in the PM. Take with or immediately following a meal. 405 tablet 3  . Multiple Vitamin (MULTIVITAMIN) capsule Take 1 capsule by mouth daily.    . NON FORMULARY TAKE ONE PILL BY MOUTH OF PROTANDIM DAILY    . Omega-3 Fatty Acids (CVS FISH OIL) 1000 MG CAPS TAKE 2 CAPSULES BY MOUTH DAILY    . omeprazole (PRILOSEC) 20 MG capsule Take 1 capsule (20 mg total) by mouth 2 (two) times daily before a meal. 600 capsule 0  . ondansetron (ZOFRAN) 8 MG tablet Take 8 mg by mouth as needed for nausea (1/2 hour before pain meds).    . rivaroxaban (XARELTO) 20 MG TABS tablet Take 1 tablet (20 mg total) by mouth daily with supper. 30 tablet 11   No current facility-administered medications for this visit.    Allergies  Allergen Reactions  . Erythromycin Nausea And Vomiting  . Amoxil [Amoxicillin] Rash  . Tape Rash and Other (See Comments)    Blisters too    Social History   Social History  . Marital Status: Married    Spouse Name: N/A  . Number of Children: N/A  . Years of Education: N/A  Occupational History  . Not on file.   Social History Main Topics  . Smoking status: Former Research scientist (life sciences)  . Smokeless tobacco: Not on file     Comment: PATIENT QUIT ON 10/ 26/2010. 30 yr history  . Alcohol Use: 0.0 oz/week    0 Standard drinks or equivalent per week     Comment: rare  . Drug Use: No  . Sexual Activity: Not on file   Other Topics Concern  . Not on file   Social History Narrative   Pt lives in [place] with spouse.  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 Heart attack Maternal Grandfather     DECEASED  . Congestive Heart Failure Father     DECEASD  . Heart attack Father     DECEASED  . Heart  attack Paternal Grandfather     DECEASED  . CAD Father     CABG age 24    ROS- All systems are reviewed and negative except as per the HPI above  Physical Exam: Filed Vitals:   05/18/16 1235  BP: 124/86  Pulse: 49  Height: 5\' 3"  (1.6 m)  Weight: 183 lb 12.8 oz (83.371 kg)    GEN- The patient is well appearing, alert and oriented x 3 today.   Head- normocephalic, atraumatic Eyes-  Sclera clear, conjunctiva pink Ears- hearing intact Oropharynx- clear Neck- supple  Lungs- Clear to ausculation bilaterally, normal work of breathing Heart- Regular rate and rhythm, no murmurs, rubs or gallops, PMI not laterally displaced GI- soft, NT, ND, + BS Extremities- no clubbing, cyanosis, or edema MS- no significant deformity or atrophy Skin- no rash or lesion Psych- euthymic mood, full affect Neuro- strength and sensation are intact  EKG today reveals sinus rhythm 49 bpm, LAE, otherwise normal ekg  Assessment and Plan:  1. Paroxysmal atrial fibrillation The patient has symptomatic paroxysmal atrial fibrillation.   Her CHADS2VASC score is 1.   She has failed medical therapy with metoprolol and flecainide. Therapeutic strategies for afib including medicine and ablation were discussed in detail with the patient today. Risk, benefits, and alternatives to EP study and radiofrequency ablation for afib were also discussed in detail today. These risks include but are not limited to stroke, bleeding, vascular damage, tamponade, perforation, damage to the esophagus, lungs, and other structures, pulmonary vein stenosis, worsening renal function, and death. The patient understands these risk and wishes to proceed.  We will therefore proceed with catheter ablation once the patient has been adequately anticoagulated. I have started xarelto 20mg  daily today.  Will proceed with cardiac CT within 1 week prior to ablation.  2. Obesity Weight loss, regular exercise advised She is limited by back  pain.  Thompson Grayer MD, [facility] 05/18/2016 5:58 PM

## 2016-05-19 ENCOUNTER — Telehealth: Payer: Self-pay | Admitting: Internal Medicine

## 2016-05-19 NOTE — Telephone Encounter (Signed)
Left message for patient with the message below.  I have asked she call back with any more questions

## 2016-05-19 NOTE — Telephone Encounter (Signed)
This is fine for her to do. Fish oil can thin the blood a little but does not interact with the Xarelto. Have her monitor for any extra or unusual bruising/bleeding.

## 2016-05-19 NOTE — Telephone Encounter (Signed)
New Message:   Pt started on Xarelto yesterday. She wants to know if it will be all right for her to continue taking Fish Oil?

## 2016-05-20 ENCOUNTER — Encounter: Payer: Self-pay | Admitting: Internal Medicine

## 2016-05-25 ENCOUNTER — Telehealth (HOSPITAL_COMMUNITY): Payer: Self-pay | Admitting: *Deleted

## 2016-05-25 NOTE — Telephone Encounter (Signed)
I cld pt to sched ov with Roderic Palau for mid August for 4 week f/u after pt procedure.  Left msg for pt to clbk.

## 2016-05-27 ENCOUNTER — Encounter (HOSPITAL_COMMUNITY): Payer: Self-pay | Admitting: *Deleted

## 2016-06-10 ENCOUNTER — Other Ambulatory Visit (INDEPENDENT_AMBULATORY_CARE_PROVIDER_SITE_OTHER): Payer: BLUE CROSS/BLUE SHIELD | Admitting: *Deleted

## 2016-06-10 DIAGNOSIS — I480 Paroxysmal atrial fibrillation: Secondary | ICD-10-CM

## 2016-06-10 LAB — CBC WITH DIFFERENTIAL/PLATELET
BASOS PCT: 0 %
Basophils Absolute: 0 cells/uL (ref 0–200)
EOS PCT: 1 %
Eosinophils Absolute: 62 cells/uL (ref 15–500)
HCT: 41.2 % (ref 35.0–45.0)
Hemoglobin: 13.9 g/dL (ref 11.7–15.5)
LYMPHS PCT: 29 %
Lymphs Abs: 1798 cells/uL (ref 850–3900)
MCH: 30.6 pg (ref 27.0–33.0)
MCHC: 33.7 g/dL (ref 32.0–36.0)
MCV: 90.7 fL (ref 80.0–100.0)
MONO ABS: 496 {cells}/uL (ref 200–950)
MONOS PCT: 8 %
MPV: 8.8 fL (ref 7.5–12.5)
Neutro Abs: 3844 cells/uL (ref 1500–7800)
Neutrophils Relative %: 62 %
PLATELETS: 314 10*3/uL (ref 140–400)
RBC: 4.54 MIL/uL (ref 3.80–5.10)
RDW: 13.2 % (ref 11.0–15.0)
WBC: 6.2 10*3/uL (ref 3.8–10.8)

## 2016-06-10 LAB — BASIC METABOLIC PANEL
BUN: 20 mg/dL (ref 7–25)
CALCIUM: 9 mg/dL (ref 8.6–10.4)
CO2: 24 mmol/L (ref 20–31)
CREATININE: 0.75 mg/dL (ref 0.50–0.99)
Chloride: 104 mmol/L (ref 98–110)
Glucose, Bld: 111 mg/dL — ABNORMAL HIGH (ref 65–99)
POTASSIUM: 4.4 mmol/L (ref 3.5–5.3)
Sodium: 139 mmol/L (ref 135–146)

## 2016-06-15 DIAGNOSIS — G8929 Other chronic pain: Secondary | ICD-10-CM | POA: Diagnosis not present

## 2016-06-15 DIAGNOSIS — M549 Dorsalgia, unspecified: Secondary | ICD-10-CM | POA: Diagnosis not present

## 2016-06-15 DIAGNOSIS — M47816 Spondylosis without myelopathy or radiculopathy, lumbar region: Secondary | ICD-10-CM | POA: Diagnosis not present

## 2016-06-15 DIAGNOSIS — M5442 Lumbago with sciatica, left side: Secondary | ICD-10-CM | POA: Diagnosis not present

## 2016-06-18 ENCOUNTER — Encounter (HOSPITAL_COMMUNITY): Payer: Self-pay

## 2016-06-18 ENCOUNTER — Ambulatory Visit (HOSPITAL_COMMUNITY)
Admission: RE | Admit: 2016-06-18 | Discharge: 2016-06-18 | Disposition: A | Discharge status: Home / Self Care | Payer: BLUE CROSS/BLUE SHIELD | Source: Ambulatory Visit | Attending: Internal Medicine | Admitting: Internal Medicine

## 2016-06-18 DIAGNOSIS — I480 Paroxysmal atrial fibrillation: Secondary | ICD-10-CM | POA: Diagnosis not present

## 2016-06-18 DIAGNOSIS — R918 Other nonspecific abnormal finding of lung field: Secondary | ICD-10-CM | POA: Diagnosis not present

## 2016-06-18 MED ORDER — IOPAMIDOL (ISOVUE-370) INJECTION 76%
INTRAVENOUS | Status: AC
Start: 2016-06-18 — End: 2016-06-18
  Administered 2016-06-18: 100 mL
  Filled 2016-06-18: qty 100

## 2016-06-22 MED ORDER — SODIUM CHLORIDE 0.9 % IV SOLN
INTRAVENOUS | Status: DC
Start: 2016-06-22 — End: 2016-06-23
  Administered 2016-06-23: 07:00:00 via INTRAVENOUS

## 2016-06-23 ENCOUNTER — Encounter (HOSPITAL_COMMUNITY): Payer: Self-pay | Admitting: Certified Registered"

## 2016-06-23 ENCOUNTER — Ambulatory Visit (HOSPITAL_COMMUNITY): Payer: BLUE CROSS/BLUE SHIELD | Admitting: Anesthesiology

## 2016-06-23 ENCOUNTER — Ambulatory Visit (HOSPITAL_COMMUNITY)
Admission: RE | Admit: 2016-06-23 | Discharge: 2016-06-24 | Disposition: A | Discharge status: Home / Self Care | Payer: BLUE CROSS/BLUE SHIELD | Source: Ambulatory Visit | Attending: Internal Medicine | Admitting: Internal Medicine

## 2016-06-23 ENCOUNTER — Encounter (HOSPITAL_COMMUNITY)
Admission: RE | Disposition: A | Discharge status: Home / Self Care | Payer: Self-pay | Source: Ambulatory Visit | Attending: Internal Medicine

## 2016-06-23 DIAGNOSIS — Z87891 Personal history of nicotine dependence: Secondary | ICD-10-CM | POA: Insufficient documentation

## 2016-06-23 DIAGNOSIS — G4700 Insomnia, unspecified: Secondary | ICD-10-CM | POA: Diagnosis not present

## 2016-06-23 DIAGNOSIS — I480 Paroxysmal atrial fibrillation: Secondary | ICD-10-CM | POA: Insufficient documentation

## 2016-06-23 DIAGNOSIS — M609 Myositis, unspecified: Secondary | ICD-10-CM | POA: Insufficient documentation

## 2016-06-23 DIAGNOSIS — Z880 Allergy status to penicillin: Secondary | ICD-10-CM | POA: Insufficient documentation

## 2016-06-23 DIAGNOSIS — E669 Obesity, unspecified: Secondary | ICD-10-CM | POA: Insufficient documentation

## 2016-06-23 DIAGNOSIS — I4891 Unspecified atrial fibrillation: Secondary | ICD-10-CM | POA: Diagnosis not present

## 2016-06-23 DIAGNOSIS — E785 Hyperlipidemia, unspecified: Secondary | ICD-10-CM | POA: Insufficient documentation

## 2016-06-23 DIAGNOSIS — H409 Unspecified glaucoma: Secondary | ICD-10-CM | POA: Insufficient documentation

## 2016-06-23 DIAGNOSIS — Z6834 Body mass index (BMI) 34.0-34.9, adult: Secondary | ICD-10-CM | POA: Diagnosis not present

## 2016-06-23 DIAGNOSIS — I4892 Unspecified atrial flutter: Secondary | ICD-10-CM | POA: Insufficient documentation

## 2016-06-23 DIAGNOSIS — Z7901 Long term (current) use of anticoagulants: Secondary | ICD-10-CM | POA: Diagnosis not present

## 2016-06-23 DIAGNOSIS — Z8249 Family history of ischemic heart disease and other diseases of the circulatory system: Secondary | ICD-10-CM | POA: Insufficient documentation

## 2016-06-23 HISTORY — PX: ELECTROPHYSIOLOGIC STUDY: SHX172A

## 2016-06-23 LAB — POCT ACTIVATED CLOTTING TIME
ACTIVATED CLOTTING TIME: 296 s
ACTIVATED CLOTTING TIME: 329 s
Activated Clotting Time: 334 seconds
Activated Clotting Time: 169 seconds

## 2016-06-23 LAB — MRSA PCR SCREENING: MRSA by PCR: NEGATIVE

## 2016-06-23 SURGERY — ATRIAL FIBRILLATION ABLATION
Anesthesia: Monitor Anesthesia Care

## 2016-06-23 MED ORDER — PROTAMINE SULFATE 10 MG/ML IV SOLN
INTRAVENOUS | Status: DC | PRN
  Administered 2016-06-23 (×3): 10 mg via INTRAVENOUS

## 2016-06-23 MED ORDER — HYDROCODONE-ACETAMINOPHEN 5-325 MG PO TABS
ORAL_TABLET | ORAL | Status: AC
  Filled 2016-06-23: qty 1

## 2016-06-23 MED ORDER — HEPARIN SODIUM (PORCINE) 1000 UNIT/ML IJ SOLN
INTRAMUSCULAR | Status: DC | PRN
  Administered 2016-06-23: 12000 [IU] via INTRAVENOUS
  Administered 2016-06-23 (×2): 2000 [IU] via INTRAVENOUS

## 2016-06-23 MED ORDER — HEPARIN SODIUM (PORCINE) 1000 UNIT/ML IJ SOLN
INTRAMUSCULAR | Status: AC
  Filled 2016-06-23: qty 1

## 2016-06-23 MED ORDER — FENTANYL CITRATE (PF) 100 MCG/2ML IJ SOLN: 25.0000 ug | INTRAMUSCULAR | Status: DC | PRN

## 2016-06-23 MED ORDER — SODIUM CHLORIDE 0.9% FLUSH
3.0000 mL | Freq: Two times a day (BID) | INTRAVENOUS | Status: DC
  Administered 2016-06-23 – 2016-06-24 (×2): 3 mL via INTRAVENOUS

## 2016-06-23 MED ORDER — FENTANYL CITRATE (PF) 250 MCG/5ML IJ SOLN
INTRAMUSCULAR | Status: DC | PRN
  Administered 2016-06-23: 50 ug via INTRAVENOUS
  Administered 2016-06-23 (×4): 25 ug via INTRAVENOUS

## 2016-06-23 MED ORDER — IOPAMIDOL (ISOVUE-370) INJECTION 76%
INTRAVENOUS | Status: DC | PRN
  Administered 2016-06-23: 2 mL via INTRAVENOUS

## 2016-06-23 MED ORDER — OXYCODONE HCL 5 MG/5ML PO SOLN
5.0000 mg | Freq: Once | ORAL | Status: DC | PRN
Start: 2016-06-23 — End: 2016-06-23

## 2016-06-23 MED ORDER — TRAMADOL HCL 50 MG PO TABS
50.0000 mg | ORAL_TABLET | Freq: Four times a day (QID) | ORAL | Status: DC | PRN
  Administered 2016-06-23: 50 mg via ORAL
  Filled 2016-06-23: qty 1

## 2016-06-23 MED ORDER — BUPIVACAINE HCL (PF) 0.25 % IJ SOLN
INTRAMUSCULAR | Status: AC
  Filled 2016-06-23: qty 30

## 2016-06-23 MED ORDER — ONDANSETRON HCL 4 MG/2ML IJ SOLN: 4.0000 mg | Freq: Four times a day (QID) | INTRAMUSCULAR | Status: DC | PRN

## 2016-06-23 MED ORDER — IOPAMIDOL (ISOVUE-370) INJECTION 76%
INTRAVENOUS | Status: AC
  Filled 2016-06-23: qty 50

## 2016-06-23 MED ORDER — HYDROCODONE-ACETAMINOPHEN 5-325 MG PO TABS
1.0000 | ORAL_TABLET | Freq: Four times a day (QID) | ORAL | Status: DC | PRN
  Administered 2016-06-23 (×2): 1 via ORAL
  Filled 2016-06-23: qty 1

## 2016-06-23 MED ORDER — SODIUM CHLORIDE 0.9 % IV SOLN: 250.0000 mL | INTRAVENOUS | Status: DC | PRN

## 2016-06-23 MED ORDER — MIDAZOLAM HCL 5 MG/5ML IJ SOLN
INTRAMUSCULAR | Status: DC | PRN
  Administered 2016-06-23 (×2): 1 mg via INTRAVENOUS

## 2016-06-23 MED ORDER — SODIUM CHLORIDE 0.9% FLUSH: 3.0000 mL | INTRAVENOUS | Status: DC | PRN

## 2016-06-23 MED ORDER — RIVAROXABAN 20 MG PO TABS
20.0000 mg | ORAL_TABLET | Freq: Every day | ORAL | Status: DC
  Administered 2016-06-23: 20 mg via ORAL
  Filled 2016-06-23: qty 1

## 2016-06-23 MED ORDER — METOPROLOL SUCCINATE ER 50 MG PO TB24
50.0000 mg | ORAL_TABLET | Freq: Every day | ORAL | Status: DC
  Administered 2016-06-24: 50 mg via ORAL
  Filled 2016-06-23: qty 1

## 2016-06-23 MED ORDER — HEPARIN SODIUM (PORCINE) 1000 UNIT/ML IJ SOLN
INTRAMUSCULAR | Status: DC | PRN
  Administered 2016-06-23 (×2): 1000 [IU] via INTRAVENOUS

## 2016-06-23 MED ORDER — PHENYLEPHRINE HCL 10 MG/ML IJ SOLN
10.0000 mg | INTRAMUSCULAR | Status: DC | PRN
  Administered 2016-06-23: 20 ug/min via INTRAVENOUS

## 2016-06-23 MED ORDER — ACETAMINOPHEN 325 MG PO TABS: 650.0000 mg | ORAL_TABLET | ORAL | Status: DC | PRN

## 2016-06-23 MED ORDER — BUPIVACAINE HCL (PF) 0.25 % IJ SOLN
INTRAMUSCULAR | Status: DC | PRN
  Administered 2016-06-23: 25 mL

## 2016-06-23 MED ORDER — DIAZEPAM 5 MG PO TABS: 5.0000 mg | ORAL_TABLET | Freq: Every day | ORAL | Status: DC | PRN

## 2016-06-23 MED ORDER — DOBUTAMINE IN D5W 4-5 MG/ML-% IV SOLN
INTRAVENOUS | Status: DC | PRN
  Administered 2016-06-23: 20 ug/kg/min via INTRAVENOUS

## 2016-06-23 MED ORDER — ONDANSETRON HCL 4 MG/2ML IJ SOLN
4.0000 mg | Freq: Four times a day (QID) | INTRAMUSCULAR | Status: AC | PRN
  Administered 2016-06-23: 4 mg via INTRAVENOUS
  Filled 2016-06-23: qty 2

## 2016-06-23 MED ORDER — PROPOFOL 500 MG/50ML IV EMUL
INTRAVENOUS | Status: DC | PRN
  Administered 2016-06-23: 10:00:00 via INTRAVENOUS
  Administered 2016-06-23: 30 ug/kg/min via INTRAVENOUS

## 2016-06-23 MED ORDER — PHENYLEPHRINE HCL 10 MG/ML IJ SOLN
INTRAMUSCULAR | Status: DC | PRN
  Administered 2016-06-23: 80 ug via INTRAVENOUS

## 2016-06-23 MED ORDER — EPHEDRINE SULFATE 50 MG/ML IJ SOLN
INTRAMUSCULAR | Status: DC | PRN
  Administered 2016-06-23 (×2): 10 mg via INTRAVENOUS

## 2016-06-23 MED ORDER — OXYCODONE HCL 5 MG PO TABS: 5.0000 mg | ORAL_TABLET | Freq: Once | ORAL | Status: DC | PRN

## 2016-06-23 MED ORDER — DOBUTAMINE IN D5W 4-5 MG/ML-% IV SOLN
INTRAVENOUS | Status: AC
  Filled 2016-06-23: qty 250

## 2016-06-23 SURGICAL SUPPLY — 19 items
BAG SNAP BAND KOVER 36X36 (MISCELLANEOUS) ×2 IMPLANT
BLANKET WARM UNDERBOD FULL ACC (MISCELLANEOUS) ×2 IMPLANT
CATH NAVISTAR SMARTTOUCH DF (ABLATOR) ×1 IMPLANT
CATH SOUNDSTAR 3D IMAGING (CATHETERS) ×1 IMPLANT
CATH VARIABLE LASSO NAV 2515 (CATHETERS) ×1 IMPLANT
CATH WEBSTER BI DIR CS D-F CRV (CATHETERS) ×1 IMPLANT
COVER SWIFTLINK CONNECTOR (BAG) ×2 IMPLANT
NDL TRANSEP BRK 71CM 407200 (NEEDLE) IMPLANT
NEEDLE TRANSEP BRK 71CM 407200 (NEEDLE) ×2 IMPLANT
PACK EP LATEX FREE (CUSTOM PROCEDURE TRAY) ×2
PACK EP LF (CUSTOM PROCEDURE TRAY) ×1 IMPLANT
PAD DEFIB LIFELINK (PAD) ×2 IMPLANT
PATCH CARTO3 (PAD) ×1 IMPLANT
SHEATH AVANTI 11F 11CM (SHEATH) ×1 IMPLANT
SHEATH PINNACLE 7F 10CM (SHEATH) ×2 IMPLANT
SHEATH PINNACLE 9F 10CM (SHEATH) ×1 IMPLANT
SHEATH SWARTZ TS SL2 63CM 8.5F (SHEATH) ×1 IMPLANT
SHIELD RADPAD SCOOP 12X17 (MISCELLANEOUS) ×2 IMPLANT
TUBING SMART ABLATE COOLFLOW (TUBING) ×1 IMPLANT

## 2016-06-23 NOTE — Discharge Summary (Signed)
ELECTROPHYSIOLOGY PROCEDURE DISCHARGE SUMMARY    Patient ID: Stacy Silva,  MRN: [id number], DOB/AGE: 1956-05-08 60 y.o.  Admit date: 06/23/2016 Discharge date: 06/24/2016  Primary Care Physician: Cari Caraway, MD Primary Cardiologist: Marlou Porch Electrophysiologist: Thompson Grayer, MD  Primary Discharge Diagnosis:  Paroxysmal atrial fibrillation status post ablation this admission  Secondary Discharge Diagnosis:  1.  Obesity 2.  Sinus bradycardia 3.  DDD  Procedures This Admission:  1.  Electrophysiology study and radiofrequency catheter ablation on 06/23/16 by Dr Thompson Grayer.  This study demonstrated sinus rhythm upon presentation; intracardiac echo reveals a moderate sized left atrium with four separate pulmonary veins without evidence of pulmonary vein stenosis; successful electrical isolation and anatomical encircling of all four pulmonary veins with radiofrequency current; isthmus dependant right atrial flutter induced with dobutamine and successfully ablated along the cavotricuspid isthmus; no early apparent complications..    Brief HPI: Stacy Silva is a 60 y.o. female with a history of paroxysmal atrial fibrillation.  They have failed medical therapy with flecainide. Risks, benefits, and alternatives to catheter ablation of atrial fibrillation were reviewed with the patient who wished to proceed.  The patient underwent cardiac CT prior to the procedure which demonstrated no LAA thrombus.    Hospital Course:  The patient was admitted and underwent EPS/RFCA of atrial fibrillation with details as outlined above.  They were monitored on telemetry overnight which demonstrated sinus rhythm.  Groin was without complication on the day of discharge.  The patient was examined and considered to be stable for discharge.  Wound care and restrictions were reviewed with the patient.  The patient will be seen back by Roderic Palau, NP in 4 weeks and Dr Rayann Heman in 12 weeks for post  ablation follow up.   This patients CHA2DS2-VASc Score and unadjusted Ischemic Stroke Rate (% per year) is equal to 2.2 % stroke rate/year from a score of 2 Above score calculated as 1 point each if present [CHF, HTN, DM, Vascular=MI/PAD/Aortic Plaque, Age if 65-74, or Female] Above score calculated as 2 points each if present [Age > 75, or Stroke/TIA/TE]   Physical Exam: Filed Vitals:   06/24/16 0000 06/24/16 0421 06/24/16 0730 06/24/16 0834  BP: 108/68 99/60 121/74 125/76  Pulse:      Temp:  98.3 F (36.8 C)  98.7 F (37.1 C)  TempSrc:  Oral  Oral  Resp: 12 13 14 15   Height:      Weight:      SpO2:  96%  95%    GEN- The patient is well appearing, alert and oriented x 3 today.   HEENT: normocephalic, atraumatic; sclera clear, conjunctiva pink; hearing intact; oropharynx clear; neck supple  Lungs- Clear to ausculation bilaterally, normal work of breathing.  No wheezes, rales, rhonchi Heart- Regular rate and rhythm, no murmurs, rubs or gallops  GI- soft, non-tender, non-distended, bowel sounds present  Extremities- no clubbing, cyanosis, or edema; DP/PT/radial pulses 2+ bilaterally, groin without hematoma/bruit MS- no significant deformity or atrophy Skin- warm and dry, no rash or lesion Psych- euthymic mood, full affect Neuro- strength and sensation are intact   Labs:   Lab Results  Component Value Date   WBC 6.2 06/10/2016   HGB 13.9 06/10/2016   HCT 41.2 06/10/2016   MCV 90.7 06/10/2016   PLT 314 06/10/2016   No results for input(s): NA, K, CL, CO2, BUN, CREATININE, CALCIUM, PROT, BILITOT, ALKPHOS, ALT, AST, GLUCOSE in the last 168 hours.  Invalid input(s): LABALBU   Discharge Medications:  Medication List    TAKE these medications        CVS FISH OIL 1000 MG Caps  Take 2,000 mg by mouth daily.     diazepam 10 MG tablet  Commonly known as:  VALIUM  Take 5 mg by mouth daily as needed (for muscle spasms).     diltiazem 60 MG tablet  Commonly known as:   CARDIZEM  Take 1 tablet (60 mg total) by mouth daily as needed (heart rate).     docusate sodium 100 MG capsule  Commonly known as:  COLACE  Take 100 mg by mouth 2 (two) times daily.     ELIDEL 1 % cream  Generic drug:  pimecrolimus  Apply 1 application topically daily.     flecainide 100 MG tablet  Commonly known as:  TAMBOCOR  Take 1 tablet (100 mg total) by mouth 2 (two) times daily.     HYDROcodone-acetaminophen 5-325 MG tablet  Commonly known as:  NORCO/VICODIN  Take 1-2 tablets by mouth every 6 (six) hours as needed for moderate pain.     latanoprost 0.005 % ophthalmic solution  Commonly known as:  XALATAN  Place 1 drop into both eyes at bedtime.     metoprolol succinate 50 MG 24 hr tablet  Commonly known as:  TOPROL-XL  Take 1 tablet (50 mg total) by mouth daily.     multivitamin capsule  Take 1 capsule by mouth daily.     NON FORMULARY  Take 1 tablet by mouth daily. PROTANDIM antioxidant     omeprazole 20 MG capsule  Commonly known as:  PRILOSEC  Take 1 capsule (20 mg total) by mouth 2 (two) times daily before a meal.     ondansetron 8 MG tablet  Commonly known as:  ZOFRAN  Take 8 mg by mouth as needed for nausea (1/2 hour before pain meds).     rivaroxaban 20 MG Tabs tablet  Commonly known as:  XARELTO  Take 1 tablet (20 mg total) by mouth daily with supper.     traMADol 50 MG tablet  Commonly known as:  ULTRAM  Take 50 mg by mouth every 6 (six) hours as needed for moderate pain.        Disposition:   Follow-up Information    Follow up with [facility] On 07/24/2016.   Specialty:  Cardiology   Why:  at 11:30AM    Contact information:   [id number] [place] [phone]      Follow up with Thompson Grayer, MD On 09/28/2016.   Specialty:  Cardiology   Why:  at 10:45AM    Contact information:   [place] [address] [phone]        Duration of Discharge Encounter: Greater than 30 minutes including physician time.  Signed, Chanetta Marshall, NP 06/24/2016 9:08 AM   I have seen, examined the patient, and reviewed the above assessment and plan. On exam, RRR. Changes to above are made where necessary.    Co Sign: Thompson Grayer, MD 06/24/2016 9:08 AM

## 2016-06-23 NOTE — Progress Notes (Addendum)
Site area: RFV x 3 Site Prior to Removal:  Level 0 Pressure Applied For: 25 Manual:yes    Patient Status During Pull:  stable Post Pull Site:  Level 0 Post Pull Instructions Given:  yes Post Pull Pulses Present:  Dressing Applied: tegaderm  Bedrest begins @ 1200 till 1800 Comments:

## 2016-06-23 NOTE — H&P (Signed)
Primary Care Physician: Cari Caraway, MD Referring Physician: Dr Marlou Porch   Stacy Silva is a 60 y.o. female with a h/o paroxysmal atrial fibrillation who presents today for afib ablation. She was initially diagnosed with atrial fibrillation 09/2009 after presenting to [facility] with symptoms of palpitations and dizziness. She was confirmed to have afib. She was evaluated by me in 2014 and initiated on flecainide. She did very well with flecainide until recently. She has recently had afib several times per week, lasting up to 12 hours. She is unaware of triggers of episodes. Sinus bradycardia has limited therapy.  Today, she denies symptoms of shortness of breath, orthopnea, PND, lower extremity edema, syncope, or neurologic sequela. She has had several brief presyncopal events when she converts from afib to sinus though this is infrequent. The patient is tolerating medications without difficulties and is otherwise without complaint today.   Past Medical History  Diagnosis Date  . Obesity   . DDD (degenerative disc disease)   . Myalgia and myositis, unspecified   . Insomnia   . Hyperlipidemia   . Glaucoma   . Paroxysmal atrial fibrillation (HCC)   . Postmenopausal   . Sinus bradycardia    Past Surgical History  Procedure Laterality Date  . Tonsillectomy    . Appendectomy    . Bunionectomy    . Laminectomy      Current Outpatient Prescriptions  Medication Sig Dispense Refill  . diazepam (VALIUM) 10 MG tablet Take 5 mg by mouth daily as needed for anxiety.   0  . diltiazem (CARDIZEM) 60 MG tablet Take 1 tablet (60 mg total) by mouth daily as needed (heart rate). 30 tablet 3  . ELIDEL 1 % cream Apply 1 application topically daily.    . flecainide (TAMBOCOR) 100 MG tablet Take 1 tablet (100 mg total) by mouth 2 (two) times daily. 180 tablet 2  . HYDROcodone-acetaminophen (NORCO/VICODIN)  5-325 MG per tablet Take 1-2 tablets by mouth every 6 (six) hours as needed for moderate pain.   0  . latanoprost (XALATAN) 0.005 % ophthalmic solution Place 1 drop into both eyes at bedtime.    . metoprolol succinate (TOPROL-XL) 50 MG 24 hr tablet Take 2 tablets by mouth in the AM and 2 1/2tablets in the PM. Take with or immediately following a meal. 405 tablet 3  . Multiple Vitamin (MULTIVITAMIN) capsule Take 1 capsule by mouth daily.    . NON FORMULARY TAKE ONE PILL BY MOUTH OF PROTANDIM DAILY    . Omega-3 Fatty Acids (CVS FISH OIL) 1000 MG CAPS TAKE 2 CAPSULES BY MOUTH DAILY    . omeprazole (PRILOSEC) 20 MG capsule Take 1 capsule (20 mg total) by mouth 2 (two) times daily before a meal. 600 capsule 0  . ondansetron (ZOFRAN) 8 MG tablet Take 8 mg by mouth as needed for nausea (1/2 hour before pain meds).    . rivaroxaban (XARELTO) 20 MG TABS tablet Take 1 tablet (20 mg total) by mouth daily with supper. 30 tablet 11   No current facility-administered medications for this visit.    Allergies  Allergen Reactions  . Erythromycin Nausea And Vomiting  . Amoxil [Amoxicillin] Rash  . Tape Rash and Other (See Comments)    Blisters too    Social History   Social History  . Marital Status: Married    Spouse Name: N/A  . Number of Children: N/A  . Years of Education: N/A   Occupational History  . Not on file.  Social History Main Topics  . Smoking status: Former Research scientist (life sciences)  . Smokeless tobacco: Not on file     Comment: PATIENT QUIT ON 10/ 26/2010. 30 yr history  . Alcohol Use: 0.0 oz/week    0 Standard drinks or equivalent per week     Comment: rare  . Drug Use: No  . Sexual Activity: Not on file   Other Topics Concern  . Not on file   Social History Narrative   Pt lives in [place] with spouse. 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 Heart attack Maternal Grandfather     DECEASED  . Congestive Heart Failure Father     DECEASD  . Heart attack Father     DECEASED  . Heart attack Paternal Grandfather     DECEASED  . CAD Father     CABG age 72    ROS- All systems are reviewed and negative except as per the HPI above  Physical Exam: Filed Vitals:   06/23/16 0557  BP: 159/85  Pulse: 52  Temp: 98 F (36.7 C)  Resp: 16                      GEN- The patient is well appearing, alert and oriented x 3 today.  Head- normocephalic, atraumatic Eyes- Sclera clear, conjunctiva pink Ears- hearing intact Oropharynx- clear Neck- supple  Lungs- Clear to ausculation bilaterally, normal work of breathing Heart- Regular rate and rhythm, no murmurs, rubs or gallops, PMI not laterally displaced GI- soft, NT, ND, + BS Extremities- no clubbing, cyanosis, or edema MS- no significant deformity or atrophy Skin- no rash or lesion Psych- euthymic mood, full affect Neuro- strength and sensation are intact   Cardiac CT reviewed with patient today  Assessment and Plan:  1. Paroxysmal atrial fibrillation The patient has symptomatic paroxysmal atrial fibrillation. Her CHADS2VASC score is 1. She has failed medical therapy with metoprolol and flecainide. Therapeutic strategies for afib including medicine and ablation were discussed in detail with the patient today. Risk, benefits, and alternatives to EP study and radiofrequency ablation for afib were also discussed in detail today. These risks include but are not limited to stroke, bleeding, vascular damage, tamponade, perforation, damage to the esophagus, lungs, and other structures, pulmonary vein stenosis, worsening renal function, and death. The patient understands these risk and wishes to proceed.    Thompson Grayer MD, [facility] 06/23/2016 7:27 AM

## 2016-06-23 NOTE — Transfer of Care (Signed)
Immediate Anesthesia Transfer of Care Note  Patient: Stacy Silva  Procedure(s) Performed: Procedure(s): Atrial Fibrillation Ablation (N/A)  Patient Location: Cath Lab  Anesthesia Type:MAC  Level of Consciousness: awake, alert  and oriented  Airway & Oxygen Therapy: Patient Spontanous Breathing  Post-op Assessment: Report given to RN, Post -op Vital signs reviewed and stable and Patient moving all extremities X 4  Post vital signs: Reviewed and stable  Last Vitals:  Filed Vitals:   06/23/16 0557  BP: 159/85  Pulse: 52  Temp: 36.7 C  Resp: 16    Last Pain: There were no vitals filed for this visit.    Patients Stated Pain Goal: 3 ([id number])  Complications: No apparent anesthesia complications

## 2016-06-23 NOTE — Anesthesia Preprocedure Evaluation (Signed)
Anesthesia Evaluation  Patient identified by MRN, date of birth, ID band Patient awake    Reviewed: Allergy & Precautions, H&P , NPO status , Patient's Chart, lab work & pertinent test results  Airway Mallampati: II   Neck ROM: full    Dental   Pulmonary former smoker,    breath sounds clear to auscultation       Cardiovascular + dysrhythmias Atrial Fibrillation  Rhythm:irregular Rate:Normal     Neuro/Psych  Neuromuscular disease    GI/Hepatic   Endo/Other  obese  Renal/GU      Musculoskeletal  (+) Arthritis ,   Abdominal   Peds  Hematology   Anesthesia Other Findings   Reproductive/Obstetrics                             Anesthesia Physical Anesthesia Plan  ASA: III  Anesthesia Plan: MAC   Post-op Pain Management:    Induction: Intravenous  Airway Management Planned: Simple Face Mask  Additional Equipment:   Intra-op Plan:   Post-operative Plan:   Informed Consent: I have reviewed the patients History and Physical, chart, labs and discussed the procedure including the risks, benefits and alternatives for the proposed anesthesia with the patient or authorized representative who has indicated his/her understanding and acceptance.     Plan Discussed with: CRNA, Anesthesiologist and Surgeon  Anesthesia Plan Comments:         Anesthesia Quick Evaluation

## 2016-06-23 NOTE — Discharge Instructions (Signed)
No driving for 4 days. No lifting over 5 lbs for 1 week. No vigorous or sexual activity for 1 week. You may return to work on 06/30/16. Keep procedure site clean & dry. If you notice increased pain, swelling, bleeding or pus, call/return!  You may shower, but no soaking baths/hot tubs/pools for 1 week.     You have an appointment set up with the [facility].  Multiple studies have shown that being followed by a dedicated atrial fibrillation clinic in addition to the standard care you receive from your other physicians improves health. We believe that enrollment in the atrial fibrillation clinic will allow [place] to better care for you.   The phone number to the [facility] is [phone]. The clinic is staffed Monday through Friday from 8:30am to 5pm.  Parking Directions: The clinic is located in the Heart and Vascular Building connected to [facility] 1)From [address] turn on to [address] and go to the 3rd entrance  (Heart and Vascular entrance) on the right. 2)Look to the right for Heart &Vascular Parking Garage. 3)A code for the entrance is required please call the clinic to receive this.   4)Take the elevators to the 1st floor. Registration is in the room with the glass walls at the end of the hallway.  If you have any trouble parking or locating the clinic, please dont hesitate to call [phone].  No driving for 4 days. No lifting over 5 lbs for 1 week. No sexual activity for 1 week. You may return to work in 1 week. Keep procedure site clean & dry. If you notice increased pain, swelling, bleeding or pus, call/return!  You may shower, but no soaking baths/hot tubs/pools for 1 week.

## 2016-06-23 NOTE — Anesthesia Postprocedure Evaluation (Signed)
Anesthesia Post Note  Patient: Stacy Silva  Procedure(s) Performed: Procedure(s) (LRB): Atrial Fibrillation Ablation (N/A)  Patient location during evaluation: PACU Anesthesia Type: MAC Level of consciousness: awake and alert Pain management: pain level controlled Vital Signs Assessment: post-procedure vital signs reviewed and stable Respiratory status: spontaneous breathing, nonlabored ventilation, respiratory function stable and patient connected to nasal cannula oxygen Cardiovascular status: stable and blood pressure returned to baseline Anesthetic complications: no    Last Vitals:  Filed Vitals:   06/23/16 1145 06/23/16 1150  BP: 104/55 117/72  Pulse: 70 69  Temp:    Resp: 12 17    Last Pain: There were no vitals filed for this visit.               [place]

## 2016-06-24 ENCOUNTER — Encounter (HOSPITAL_COMMUNITY): Payer: Self-pay | Admitting: Internal Medicine

## 2016-06-24 DIAGNOSIS — E669 Obesity, unspecified: Secondary | ICD-10-CM | POA: Diagnosis not present

## 2016-06-24 DIAGNOSIS — E785 Hyperlipidemia, unspecified: Secondary | ICD-10-CM | POA: Diagnosis not present

## 2016-06-24 DIAGNOSIS — I480 Paroxysmal atrial fibrillation: Secondary | ICD-10-CM

## 2016-06-24 DIAGNOSIS — H409 Unspecified glaucoma: Secondary | ICD-10-CM | POA: Diagnosis not present

## 2016-06-24 DIAGNOSIS — Z8249 Family history of ischemic heart disease and other diseases of the circulatory system: Secondary | ICD-10-CM | POA: Diagnosis not present

## 2016-06-24 DIAGNOSIS — Z6834 Body mass index (BMI) 34.0-34.9, adult: Secondary | ICD-10-CM | POA: Diagnosis not present

## 2016-06-24 DIAGNOSIS — Z87891 Personal history of nicotine dependence: Secondary | ICD-10-CM | POA: Diagnosis not present

## 2016-06-24 DIAGNOSIS — G4700 Insomnia, unspecified: Secondary | ICD-10-CM | POA: Diagnosis not present

## 2016-06-24 DIAGNOSIS — M609 Myositis, unspecified: Secondary | ICD-10-CM | POA: Diagnosis not present

## 2016-06-24 DIAGNOSIS — Z880 Allergy status to penicillin: Secondary | ICD-10-CM | POA: Diagnosis not present

## 2016-06-24 DIAGNOSIS — I4892 Unspecified atrial flutter: Secondary | ICD-10-CM | POA: Diagnosis not present

## 2016-06-24 DIAGNOSIS — Z7901 Long term (current) use of anticoagulants: Secondary | ICD-10-CM | POA: Diagnosis not present

## 2016-06-24 MED ORDER — METOPROLOL SUCCINATE ER 50 MG PO TB24: 50.0000 mg | ORAL_TABLET | Freq: Every day | ORAL | Status: DC

## 2016-06-24 NOTE — Progress Notes (Signed)
Order for discharge acknowledged. Pt and husband informed and verbalized understanding. L forearm iv access removed and pressure applied for 8 minutes. Pressure drsg applied to site. Discharge instructions explained to pt and husband, both verbalized understanding. Pt's belongings packed. CCMD and Elink both notified of discharge. VSS.

## 2016-06-24 NOTE — Discharge Planning (Signed)
[facility] consulted to verify quantity of Rx faxed in.  EDCM contacted Amber, NP to change # to 90.  [facility] called [place] [phone] to clarify order.

## 2016-07-22 DIAGNOSIS — M549 Dorsalgia, unspecified: Secondary | ICD-10-CM | POA: Diagnosis not present

## 2016-07-22 DIAGNOSIS — M546 Pain in thoracic spine: Secondary | ICD-10-CM | POA: Diagnosis not present

## 2016-07-22 DIAGNOSIS — M47816 Spondylosis without myelopathy or radiculopathy, lumbar region: Secondary | ICD-10-CM | POA: Diagnosis not present

## 2016-07-22 DIAGNOSIS — G8929 Other chronic pain: Secondary | ICD-10-CM | POA: Diagnosis not present

## 2016-07-24 ENCOUNTER — Encounter (HOSPITAL_COMMUNITY): Payer: Self-pay | Admitting: Nurse Practitioner

## 2016-07-24 ENCOUNTER — Ambulatory Visit (HOSPITAL_COMMUNITY)
Admission: RE | Admit: 2016-07-24 | Discharge: 2016-07-24 | Disposition: A | Discharge status: Home / Self Care | Payer: BLUE CROSS/BLUE SHIELD | Source: Ambulatory Visit | Attending: Nurse Practitioner | Admitting: Nurse Practitioner

## 2016-07-24 VITALS — BP 112/78 | HR 66 | Ht 63.0 in | Wt 188.0 lb

## 2016-07-24 DIAGNOSIS — E669 Obesity, unspecified: Secondary | ICD-10-CM | POA: Diagnosis not present

## 2016-07-24 DIAGNOSIS — E785 Hyperlipidemia, unspecified: Secondary | ICD-10-CM | POA: Diagnosis not present

## 2016-07-24 DIAGNOSIS — Z881 Allergy status to other antibiotic agents status: Secondary | ICD-10-CM | POA: Diagnosis not present

## 2016-07-24 DIAGNOSIS — Z87891 Personal history of nicotine dependence: Secondary | ICD-10-CM | POA: Insufficient documentation

## 2016-07-24 DIAGNOSIS — H409 Unspecified glaucoma: Secondary | ICD-10-CM | POA: Insufficient documentation

## 2016-07-24 DIAGNOSIS — Z888 Allergy status to other drugs, medicaments and biological substances status: Secondary | ICD-10-CM | POA: Insufficient documentation

## 2016-07-24 DIAGNOSIS — Z7901 Long term (current) use of anticoagulants: Secondary | ICD-10-CM | POA: Diagnosis not present

## 2016-07-24 DIAGNOSIS — Z8249 Family history of ischemic heart disease and other diseases of the circulatory system: Secondary | ICD-10-CM | POA: Insufficient documentation

## 2016-07-24 DIAGNOSIS — Z6833 Body mass index (BMI) 33.0-33.9, adult: Secondary | ICD-10-CM | POA: Diagnosis not present

## 2016-07-24 DIAGNOSIS — I480 Paroxysmal atrial fibrillation: Secondary | ICD-10-CM | POA: Diagnosis not present

## 2016-07-24 DIAGNOSIS — Z79899 Other long term (current) drug therapy: Secondary | ICD-10-CM | POA: Diagnosis not present

## 2016-07-24 MED ORDER — METOPROLOL SUCCINATE ER 50 MG PO TB24: ORAL_TABLET | ORAL | 3 refills | Status: DC

## 2016-07-24 NOTE — Patient Instructions (Signed)
Your physician has recommended you make the following change in your medication:  1)Stop Flecainide 2)In 1 week decrease metoprolol to 25mg  once a day (1/2 tablet of the 50mg  tablet) 3)If after decreasing dose of metoprolol for one week -- no improvement in aches/pain let us know for discussion of changing xarelto

## 2016-07-24 NOTE — Progress Notes (Signed)
Patient ID: Stacy Silva, female   DOB: [birth year], 60 y.o.   MRN: [id number]     Primary Care Physician: Cari Caraway, MD Referring Physician: Dr. Delorse Lek BRYAN MOLDENHAUER is a 60 y.o. female with a h/o PAF that has been doing well on flecainide for several years and has been keeping the pt in rhythm until recently. She has noticed several breakthrough episodes recently which usually respond to prn cardizem but cannot tolerate daily cardizem. She has a chasvasc score of 1 and is on a daily asa 325 mg. Pt wants to discuss means of being able to stay in SR. She does not drink alcohol or smoke. No sleep apnea, or high caffeine use. She did get an cortisone shot in her back yesterday and started with afib 12 hours yesterday, now back in rhythm with prn Cardizem.  Pt was referred to Dr. Rayann Heman for ablation which she had done 06/23/16,and is here for f/u. She has noticed a lot of aches/pains since procedure and she is wondering if from meds. She has not had any afib. She has weaning off her flecainide and will stop flecainide at this visit as discussed with Dr. Rayann Heman. Would also like to cut metoprolol in half. No dysphagia or groin issues since procedure.  Today, she denies symptoms of palpitations, chest pain, shortness of breath, orthopnea, PND, lower extremity edema, dizziness, presyncope, syncope, or neurologic sequela. The patient is tolerating medications without difficulties and is otherwise without complaint today.   Past Medical History:  Diagnosis Date  . DDD (degenerative disc disease)   . Glaucoma   . Hyperlipidemia   . Insomnia   . Myalgia and myositis, unspecified   . Obesity   . Paroxysmal atrial fibrillation (HCC)   . Postmenopausal   . Sinus bradycardia    Past Surgical History:  Procedure Laterality Date  . APPENDECTOMY    . BUNIONECTOMY    . ELECTROPHYSIOLOGIC STUDY N/A 06/23/2016   Procedure: Atrial Fibrillation Ablation;  Surgeon: Thompson Grayer, MD;  Location:  [facility];  Service: Cardiovascular;  Laterality: N/A;  . LAMINECTOMY    . TONSILLECTOMY      Current Outpatient Prescriptions  Medication Sig Dispense Refill  . diazepam (VALIUM) 10 MG tablet Take 5 mg by mouth daily as needed (for muscle spasms).   0  . diltiazem (CARDIZEM) 60 MG tablet Take 1 tablet (60 mg total) by mouth daily as needed (heart rate). 30 tablet 3  . docusate sodium (COLACE) 100 MG capsule Take 100 mg by mouth 2 (two) times daily.    Marland Kitchen ELIDEL 1 % cream Apply 1 application topically daily.    Marland Kitchen HYDROcodone-acetaminophen (NORCO/VICODIN) 5-325 MG per tablet Take 1-2 tablets by mouth every 6 (six) hours as needed for moderate pain.   0  . latanoprost (XALATAN) 0.005 % ophthalmic solution Place 1 drop into both eyes at bedtime.    . metoprolol succinate (TOPROL-XL) 50 MG 24 hr tablet Take 1/2 tablet (25mg ) by mouth daily 405 tablet 3  . Multiple Vitamin (MULTIVITAMIN) capsule Take 1 capsule by mouth daily.    . NON FORMULARY Take 1 tablet by mouth daily. PROTANDIM antioxidant    . omeprazole (PRILOSEC) 20 MG capsule Take 1 capsule (20 mg total) by mouth 2 (two) times daily before a meal. (Patient taking differently: Take 20 mg by mouth 2 (two) times daily as needed (for acid reflux or heartburn). ) 600 capsule 0  . ondansetron (ZOFRAN) 8 MG tablet Take 8  mg by mouth as needed for nausea (1/2 hour before pain meds).    . rivaroxaban (XARELTO) 20 MG TABS tablet Take 1 tablet (20 mg total) by mouth daily with supper. 30 tablet 11  . traMADol (ULTRAM) 50 MG tablet Take 50 mg by mouth every 6 (six) hours as needed for moderate pain.   0   No current facility-administered medications for this encounter.     Allergies  Allergen Reactions  . Erythromycin Nausea And Vomiting  . Pravastatin Other (See Comments)    Leg pain, could not walk up stairs  . Amoxil [Amoxicillin] Rash  . Tape Rash and Other (See Comments)    Blisters too    Social History   Social History  .  Marital status: Married    Spouse name: N/A  . Number of children: N/A  . Years of education: N/A   Occupational History  . Not on file.   Social History Main Topics  . Smoking status: Former Research scientist (life sciences)  . Smokeless tobacco: Not on file     Comment: PATIENT QUIT ON 10/ 26/2010. 30 yr history  . Alcohol use No  . Drug use: No  . Sexual activity: Not on file   Other Topics Concern  . Not on file   Social History Narrative   Pt lives in [place] with spouse.  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 Heart attack Maternal Grandfather     DECEASED  . Congestive Heart Failure Father     DECEASD  . Heart attack Father     DECEASED  . Heart attack Paternal Grandfather     DECEASED  . CAD Father     CABG age 8    ROS- All systems are reviewed and negative except as per the HPI above  Physical Exam: Vitals:   07/24/16 1136  BP: 112/78  Pulse: 66  Weight: 188 lb (85.3 kg)  Height: 5\' 3"  (1.6 m)    GEN- The patient is well appearing, alert and oriented x 3 today.   Head- normocephalic, atraumatic Eyes-  Sclera clear, conjunctiva pink Ears- hearing intact Oropharynx- clear Neck- supple, no JVP Lymph- no cervical lymphadenopathy Lungs- Clear to ausculation bilaterally, normal work of breathing Heart- Regular rate and rhythm, no murmurs, rubs or gallops, PMI not laterally displaced GI- soft, NT, ND, + BS Extremities- no clubbing, cyanosis, or edema MS- no significant deformity or atrophy Skin- no rash or lesion Psych- euthymic mood, full affect Neuro- strength and sensation are intact  EKG-NSR at 66 bpm, Pr int 162 ms, Qrs int 72 ms, QTc 452 ms Epic records reviewed   Assessment and Plan: 1. PAF Maintaining SR since ablation and wants to stop drug. Will wait one week after stopping flecainide and then cut metoprolol in half to 25 mg a day If these drugs changes do not improve myalgias, pt  would like to try to change xarelto to eliquis She will let us know results of above changes  F/u as scheduled with Dr. Rayann Heman 10/16 afib clinic as needed  Butch Penny C. Simya Tercero, [facility] [address] [place], [place] [address] [phone]

## 2016-08-10 ENCOUNTER — Other Ambulatory Visit (HOSPITAL_COMMUNITY): Payer: Self-pay | Admitting: *Deleted

## 2016-08-10 ENCOUNTER — Telehealth (HOSPITAL_COMMUNITY): Payer: Self-pay | Admitting: *Deleted

## 2016-08-10 MED ORDER — APIXABAN 5 MG PO TABS: 5.0000 mg | ORAL_TABLET | Freq: Two times a day (BID) | ORAL | 0 refills | Status: DC

## 2016-08-10 MED ORDER — APIXABAN 5 MG PO TABS: 5.0000 mg | ORAL_TABLET | Freq: Two times a day (BID) | ORAL | 6 refills | Status: DC

## 2016-08-10 NOTE — Telephone Encounter (Signed)
Pt called in stating her symptoms of myalgia has not improved with stopping flecainide and she would like to try switching her xarelto to eliquis.  RX was sent to pharmacy of choice and instructions on how to switch without interruption was discussed. Pt verbalized understanding.

## 2016-08-13 ENCOUNTER — Telehealth (HOSPITAL_COMMUNITY): Payer: Self-pay | Admitting: *Deleted

## 2016-08-13 MED ORDER — RIVAROXABAN 20 MG PO TABS: 20.0000 mg | ORAL_TABLET | Freq: Every day | ORAL | Status: DC

## 2016-08-13 NOTE — Telephone Encounter (Signed)
Pt called stating she actually felt worse on eliquis and switched back to xarelto. She is wanting to take Co-Q 10 supplement to help with joint pain and this was approved by Roderic Palau NP. Pt in agreement with plan.

## 2016-09-28 ENCOUNTER — Ambulatory Visit (INDEPENDENT_AMBULATORY_CARE_PROVIDER_SITE_OTHER): Payer: BLUE CROSS/BLUE SHIELD | Admitting: Internal Medicine

## 2016-09-28 ENCOUNTER — Encounter: Payer: Self-pay | Admitting: Internal Medicine

## 2016-09-28 VITALS — BP 120/82 | HR 66 | Ht 63.0 in | Wt 189.6 lb

## 2016-09-28 DIAGNOSIS — I480 Paroxysmal atrial fibrillation: Secondary | ICD-10-CM | POA: Diagnosis not present

## 2016-09-28 MED ORDER — METOPROLOL SUCCINATE ER 50 MG PO TB24: 50.0000 mg | ORAL_TABLET | Freq: Every day | ORAL | 3 refills | Status: DC

## 2016-09-28 NOTE — Progress Notes (Signed)
Primary Care Physician: Cari Caraway, MD Referring Physician:  Dr Marlou Porch   Stacy Silva is a 60 y.o. female with a h/o paroxysmal atrial fibrillation who presents today for EP follow-up.  She has done well since her ablation.  She has had no procedure related complications.  Pleased that she has had no afib.  She feels "better" on metoprolol and has been reluctant to reduce it.  + myalgias and arthralgias which she attributes to xarelto.  Today, she denies symptoms of shortness of breath, orthopnea, PND, lower extremity edema, syncope, or neurologic sequela.  The patient is tolerating medications without difficulties and is otherwise without complaint today.   Past Medical History:  Diagnosis Date  . DDD (degenerative disc disease)   . Glaucoma   . Hyperlipidemia   . Insomnia   . Myalgia and myositis, unspecified   . Obesity   . Paroxysmal atrial fibrillation (HCC)   . Postmenopausal   . Sinus bradycardia    Past Surgical History:  Procedure Laterality Date  . APPENDECTOMY    . BUNIONECTOMY    . ELECTROPHYSIOLOGIC STUDY N/A 06/23/2016   Procedure: Atrial Fibrillation Ablation;  Surgeon: Thompson Grayer, MD;  Location: [facility];  Service: Cardiovascular;  Laterality: N/A;  . LAMINECTOMY    . TONSILLECTOMY      Current Outpatient Prescriptions  Medication Sig Dispense Refill  . diazepam (VALIUM) 10 MG tablet Take 5 mg by mouth daily as needed (for muscle spasms).   0  . diltiazem (CARDIZEM) 60 MG tablet Take 1 tablet (60 mg total) by mouth daily as needed (heart rate). 30 tablet 3  . docusate sodium (COLACE) 100 MG capsule Take 100 mg by mouth 2 (two) times daily.    Marland Kitchen ELIDEL 1 % cream Apply 1 application topically daily.    Marland Kitchen HYDROcodone-acetaminophen (NORCO/VICODIN) 5-325 MG per tablet Take 1-2 tablets by mouth every 6 (six) hours as needed for moderate pain.   0  . latanoprost (XALATAN) 0.005 % ophthalmic solution Place 1 drop into both eyes at bedtime.    Marland Kitchen  latanoprost (XALATAN) 0.005 % ophthalmic solution Place 1 drop into both eyes as directed.    . metoprolol succinate (TOPROL-XL) 50 MG 24 hr tablet Take 1/2 tablet (25mg ) by mouth daily 405 tablet 3  . Multiple Vitamin (MULTIVITAMIN) capsule Take 1 capsule by mouth daily.    . NON FORMULARY Take 1 tablet by mouth daily. PROTANDIM antioxidant    . omeprazole (PRILOSEC) 20 MG capsule Take 1 capsule (20 mg total) by mouth 2 (two) times daily before a meal. (Patient taking differently: Take 20 mg by mouth 2 (two) times daily as needed (for acid reflux or heartburn). ) 600 capsule 0  . ondansetron (ZOFRAN) 8 MG tablet Take 8 mg by mouth as needed for nausea (1/2 hour before pain meds).    . rivaroxaban (XARELTO) 20 MG TABS tablet Take 1 tablet (20 mg total) by mouth daily with supper. 30 tablet   . traMADol (ULTRAM) 50 MG tablet Take 50 mg by mouth every 6 (six) hours as needed for moderate pain.   0   No current facility-administered medications for this visit.     Allergies  Allergen Reactions  . Erythromycin Nausea And Vomiting  . Pravastatin Other (See Comments)    Leg pain, could not walk up stairs  . Amoxil [Amoxicillin] Rash  . Tape Rash and Other (See Comments)    Blisters too    Social History   Social History  .  Marital status: Married    Spouse name: N/A  . Number of children: N/A  . Years of education: N/A   Occupational History  . Not on file.   Social History Main Topics  . Smoking status: Former Research scientist (life sciences)  . Smokeless tobacco: Not on file     Comment: PATIENT QUIT ON 10/ 26/2010. 30 yr history  . Alcohol use No  . Drug use: No  . Sexual activity: Not on file   Other Topics Concern  . Not on file   Social History Narrative   Pt lives in [place] with spouse.  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 Heart attack Maternal Grandfather     DECEASED  . Congestive Heart Failure Father      DECEASD  . Heart attack Father     DECEASED  . Heart attack Paternal Grandfather     DECEASED  . CAD Father     CABG age 50    ROS- All systems are reviewed and negative except as per the HPI above  Physical Exam: Vitals:   09/28/16 1050  BP: 120/82  Pulse: 66  Weight: 189 lb 9.6 oz (86 kg)  Height: 5\' 3"  (1.6 m)    GEN- The patient is well appearing, alert and oriented x 3 today.   Head- normocephalic, atraumatic Eyes-  Sclera clear, conjunctiva pink Ears- hearing intact Oropharynx- clear Neck- supple  Lungs- Clear to ausculation bilaterally, normal work of breathing Heart- Regular rate and rhythm, no murmurs, rubs or gallops, PMI not laterally displaced GI- soft, NT, ND, + BS Extremities- no clubbing, cyanosis, or edema MS- no significant deformity or atrophy Skin- no rash or lesion Psych- euthymic mood, full affect Neuro- strength and sensation are intact  EKG today reveals sinus rhythm 66 bpm, normal ekg  Assessment and Plan:  1. Paroxysmal atrial fibrillation Doing well s/p ablation off AAD therapy Her CHADS2VASC score is 1. Stop xarelto at this time. She does not wish to reduce metoprolol currently  2. Obesity Weight loss, regular exercise advised She is limited by back pain.  Return to see me in 3 months  Thompson Grayer MD, [facility] 09/28/2016 11:12 AM

## 2016-09-28 NOTE — Patient Instructions (Addendum)
Medication Instructions:  Your physician has recommended you make the following change in your medication:  1) Stop Xarelto 2) Keep Metoprolol at 50 mg daily   Labwork: None ordered   Testing/Procedures: None ordered   Follow-Up: Your physician recommends that you schedule a follow-up appointment in: 3 months with Dr Rayann Heman   Any Other Special Instructions Will Be Listed Below (If Applicable).     If you need a refill on your cardiac medications before your next appointment, please call your pharmacy.

## 2016-10-16 DIAGNOSIS — M549 Dorsalgia, unspecified: Secondary | ICD-10-CM | POA: Diagnosis not present

## 2016-10-16 DIAGNOSIS — M5442 Lumbago with sciatica, left side: Secondary | ICD-10-CM | POA: Diagnosis not present

## 2016-10-16 DIAGNOSIS — G8929 Other chronic pain: Secondary | ICD-10-CM | POA: Diagnosis not present

## 2016-10-16 DIAGNOSIS — M47816 Spondylosis without myelopathy or radiculopathy, lumbar region: Secondary | ICD-10-CM | POA: Diagnosis not present

## 2016-10-22 DIAGNOSIS — H401131 Primary open-angle glaucoma, bilateral, mild stage: Secondary | ICD-10-CM | POA: Diagnosis not present

## 2016-12-23 DIAGNOSIS — M2550 Pain in unspecified joint: Secondary | ICD-10-CM | POA: Diagnosis not present

## 2016-12-30 ENCOUNTER — Ambulatory Visit: Payer: BLUE CROSS/BLUE SHIELD | Admitting: Internal Medicine

## 2017-01-07 ENCOUNTER — Encounter: Payer: Self-pay | Admitting: Internal Medicine

## 2017-01-13 DIAGNOSIS — R768 Other specified abnormal immunological findings in serum: Secondary | ICD-10-CM | POA: Diagnosis not present

## 2017-01-14 ENCOUNTER — Other Ambulatory Visit: Payer: Self-pay | Admitting: Family Medicine

## 2017-01-14 DIAGNOSIS — R030 Elevated blood-pressure reading, without diagnosis of hypertension: Secondary | ICD-10-CM | POA: Diagnosis not present

## 2017-01-14 DIAGNOSIS — Z6834 Body mass index (BMI) 34.0-34.9, adult: Secondary | ICD-10-CM | POA: Diagnosis not present

## 2017-01-14 DIAGNOSIS — M549 Dorsalgia, unspecified: Secondary | ICD-10-CM | POA: Diagnosis not present

## 2017-01-14 DIAGNOSIS — Z1231 Encounter for screening mammogram for malignant neoplasm of breast: Secondary | ICD-10-CM

## 2017-01-14 DIAGNOSIS — M47816 Spondylosis without myelopathy or radiculopathy, lumbar region: Secondary | ICD-10-CM | POA: Diagnosis not present

## 2017-01-22 ENCOUNTER — Ambulatory Visit: Payer: BLUE CROSS/BLUE SHIELD | Admitting: Internal Medicine

## 2017-01-25 DIAGNOSIS — H524 Presbyopia: Secondary | ICD-10-CM | POA: Diagnosis not present

## 2017-01-25 DIAGNOSIS — H52203 Unspecified astigmatism, bilateral: Secondary | ICD-10-CM | POA: Diagnosis not present

## 2017-01-25 DIAGNOSIS — H2513 Age-related nuclear cataract, bilateral: Secondary | ICD-10-CM | POA: Diagnosis not present

## 2017-01-25 DIAGNOSIS — H401131 Primary open-angle glaucoma, bilateral, mild stage: Secondary | ICD-10-CM | POA: Diagnosis not present

## 2017-01-27 ENCOUNTER — Ambulatory Visit (INDEPENDENT_AMBULATORY_CARE_PROVIDER_SITE_OTHER): Payer: BLUE CROSS/BLUE SHIELD | Admitting: Internal Medicine

## 2017-01-27 ENCOUNTER — Encounter: Payer: Self-pay | Admitting: Internal Medicine

## 2017-01-27 VITALS — BP 118/74 | HR 59 | Ht 63.0 in | Wt 188.0 lb

## 2017-01-27 DIAGNOSIS — I480 Paroxysmal atrial fibrillation: Secondary | ICD-10-CM

## 2017-01-27 NOTE — Patient Instructions (Signed)

## 2017-01-27 NOTE — Progress Notes (Signed)
Primary Care Physician: Cari Caraway, MD Referring Physician:  Dr Marlou Porch   Stacy Silva is a 61 y.o. female with a h/o paroxysmal atrial fibrillation who presents today for EP follow-up.  She has done well since her ablation.  She has had no afib.  She continues to have chronic back pain and joint pain.  She has not had any improvement since stopping her xarelto.  Looking back, she has had these symptoms intermittently since at least 2014.  She has recently been evaluated by Dr Addison Lank and has been found to have antibodies consistent with lupus.  She sees rheumatolgy soon.   Today, she denies symptoms of shortness of breath, orthopnea, PND, lower extremity edema, syncope, or neurologic sequela.  The patient is tolerating medications without difficulties and is otherwise without complaint today.   Past Medical History:  Diagnosis Date  . DDD (degenerative disc disease)   . Glaucoma   . Hyperlipidemia   . Insomnia   . Myalgia and myositis, unspecified   . Obesity   . Paroxysmal atrial fibrillation (HCC)   . Postmenopausal   . Sinus bradycardia    Past Surgical History:  Procedure Laterality Date  . APPENDECTOMY    . BUNIONECTOMY    . ELECTROPHYSIOLOGIC STUDY N/A 06/23/2016   PVI and CTI by Dr Rayann Heman  . LAMINECTOMY    . TONSILLECTOMY      Current Outpatient Prescriptions  Medication Sig Dispense Refill  . diazepam (VALIUM) 10 MG tablet Take 5 mg by mouth daily as needed (for muscle spasms).   0  . diltiazem (CARDIZEM) 60 MG tablet Take 1 tablet (60 mg total) by mouth daily as needed (heart rate). 30 tablet 3  . docusate sodium (COLACE) 100 MG capsule Take 100 mg by mouth 2 (two) times daily.    Marland Kitchen ELIDEL 1 % cream Apply 1 application topically daily.    Marland Kitchen HYDROcodone-acetaminophen (NORCO/VICODIN) 5-325 MG per tablet Take 1-2 tablets by mouth every 6 (six) hours as needed for moderate pain.   0  . latanoprost (XALATAN) 0.005 % ophthalmic solution Place 1 drop into both eyes as  directed.    . metoprolol succinate (TOPROL-XL) 50 MG 24 hr tablet Take 1 tablet (50 mg total) by mouth daily. 90 tablet 3  . Misc Natural Products (TART CHERRY ADVANCED PO) Take 3 capsules by mouth daily.    . Multiple Vitamin (MULTIVITAMIN) capsule Take 1 capsule by mouth daily.    . NON FORMULARY Take 1 tablet by mouth daily. PROTANDIM antioxidant    . omeprazole (PRILOSEC) 40 MG capsule Take 40 mg by mouth daily as needed (heartburn/acid reflux).    . ondansetron (ZOFRAN) 8 MG tablet Take 8 mg by mouth as needed for nausea (1/2 hour before pain meds).    . traMADol (ULTRAM) 50 MG tablet Take 50 mg by mouth every 6 (six) hours as needed for moderate pain.   0   No current facility-administered medications for this visit.     Allergies  Allergen Reactions  . Erythromycin Nausea And Vomiting  . Pravastatin Other (See Comments)    Leg pain, could not walk up stairs  . Amoxil [Amoxicillin] Rash  . Tape Rash and Other (See Comments)    Blisters too    Social History   Social History  . Marital status: Married    Spouse name: N/A  . Number of children: N/A  . Years of education: N/A   Occupational History  . Not on file.   Social  History Main Topics  . Smoking status: Former Research scientist (life sciences)  . Smokeless tobacco: Never Used     Comment: PATIENT QUIT ON 10/ 26/2010. 30 yr history  . Alcohol use No  . Drug use: No  . Sexual activity: Not on file   Other Topics Concern  . Not on file   Social History Narrative   Pt lives in [place] with spouse.  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 Heart attack Maternal Grandfather     DECEASED  . Congestive Heart Failure Father     DECEASD  . Heart attack Father     DECEASED  . Heart attack Paternal Grandfather     DECEASED  . CAD Father     CABG age 60    ROS- All systems are reviewed and negative except as per the HPI above  Physical Exam: Vitals:    01/27/17 1025  BP: 118/74  Pulse: (!) 59  Weight: 188 lb (85.3 kg)  Height: 5\' 3"  (1.6 m)    GEN- The patient is well appearing, alert and oriented x 3 today.   Head- normocephalic, atraumatic Eyes-  Sclera clear, conjunctiva pink Ears- hearing intact Oropharynx- clear Neck- supple  Lungs- Clear to ausculation bilaterally, normal work of breathing Heart- Regular rate and rhythm, no murmurs, rubs or gallops, PMI not laterally displaced GI- soft, NT, ND, + BS Extremities- no clubbing, cyanosis, or edema MS- no significant deformity or atrophy Skin- no rash or lesion Psych- euthymic mood, full affect Neuro- strength and sensation are intact  EKG today reveals sinus rhythm 59 bpm, normal ekg  Assessment and Plan:  1. Paroxysmal atrial fibrillation Doing well s/p ablation off AAD therapy Her CHADS2VASC score is 1. She declines anticoagulation currently Continue metoprolol per her preference  2. Obesity Weight loss, regular exercise advised She is limited by back pain.  I have asked her to find exercises recommended by her rheumatolgist next week that will allow her to remain active.  3. ? Lupus Records back to at least 2014 document back pain and myalgias.  She has had prior statin intolerance.  Her symptoms did not improve off of xarelto.  I have informed her that I do not feel that her current condition is caused by or exacerbated by either her ablation or xarelto. I have advised that if her treating physicians feel that steroids are required that I would advised that she comply with their recommendations.  She is aware that steroids do at times increase afib episodes.  I told her that we could manage her afib if it occurs through the AF clinic  Return to see me in 36months   Thompson Grayer MD, [facility] 01/27/2017 10:49 AM

## 2017-02-10 DIAGNOSIS — D8989 Other specified disorders involving the immune mechanism, not elsewhere classified: Secondary | ICD-10-CM | POA: Diagnosis not present

## 2017-02-12 ENCOUNTER — Ambulatory Visit
Admission: RE | Admit: 2017-02-12 | Discharge: 2017-02-12 | Disposition: A | Discharge status: Home / Self Care | Payer: BLUE CROSS/BLUE SHIELD | Source: Ambulatory Visit | Attending: Family Medicine | Admitting: Family Medicine

## 2017-02-12 DIAGNOSIS — Z1231 Encounter for screening mammogram for malignant neoplasm of breast: Secondary | ICD-10-CM | POA: Diagnosis not present

## 2017-03-23 DIAGNOSIS — M359 Systemic involvement of connective tissue, unspecified: Secondary | ICD-10-CM | POA: Diagnosis not present

## 2017-03-23 DIAGNOSIS — M5136 Other intervertebral disc degeneration, lumbar region: Secondary | ICD-10-CM | POA: Diagnosis not present

## 2017-03-31 ENCOUNTER — Telehealth (HOSPITAL_COMMUNITY): Payer: Self-pay | Admitting: *Deleted

## 2017-03-31 NOTE — Telephone Encounter (Signed)
Patient called in stating she was recently diagnosed with lupus and is on a small prednisone taper for 6 weeks. Over the last few days she has noticed increased heart rates in the 80-90s where she is usually in the 50-60s. She has also been recently taking Serretia for allergies for about the last 5 days. Discussed with Roderic Palau NP who recommended stopping serretia and see if improvement with heart rate. If not would be happy to see for ekg to confirm in NSR and no aflutter. Also reminded patient if HR should consistently stay above 100 can use PRN cardizem for rate control. Patient verbalized understanding of instructions.

## 2017-04-12 DIAGNOSIS — R030 Elevated blood-pressure reading, without diagnosis of hypertension: Secondary | ICD-10-CM | POA: Diagnosis not present

## 2017-04-12 DIAGNOSIS — M47816 Spondylosis without myelopathy or radiculopathy, lumbar region: Secondary | ICD-10-CM | POA: Diagnosis not present

## 2017-04-12 DIAGNOSIS — Z6832 Body mass index (BMI) 32.0-32.9, adult: Secondary | ICD-10-CM | POA: Diagnosis not present

## 2017-05-03 ENCOUNTER — Encounter: Payer: Self-pay | Admitting: Internal Medicine

## 2017-05-03 ENCOUNTER — Other Ambulatory Visit: Payer: Self-pay

## 2017-05-03 ENCOUNTER — Ambulatory Visit (INDEPENDENT_AMBULATORY_CARE_PROVIDER_SITE_OTHER): Payer: BLUE CROSS/BLUE SHIELD | Admitting: Internal Medicine

## 2017-05-03 VITALS — BP 118/72 | HR 57 | Ht 63.0 in | Wt 181.8 lb

## 2017-05-03 DIAGNOSIS — H401131 Primary open-angle glaucoma, bilateral, mild stage: Secondary | ICD-10-CM | POA: Diagnosis not present

## 2017-05-03 DIAGNOSIS — I480 Paroxysmal atrial fibrillation: Secondary | ICD-10-CM

## 2017-05-03 NOTE — Progress Notes (Signed)
PCP: Cari Caraway, MD Primary Cardiologist:  Dr Marlou Porch  Stacy Silva is a 61 y.o. female who presents today for routine electrophysiology followup.  Since last being seen in our clinic, the patient reports doing very well.  She has had no afib.  She has rare "vibration" in her chest with taking prednisone.  She continues to work with rheumatology on her diffuse joint aches.  Today, she denies symptoms of palpitations, chest pain, shortness of breath,  lower extremity edema, dizziness, presyncope, or syncope.  The patient is otherwise without complaint today.   Past Medical History:  Diagnosis Date  . DDD (degenerative disc disease)   . Glaucoma   . Hyperlipidemia   . Insomnia   . Myalgia and myositis, unspecified   . Obesity   . Paroxysmal atrial fibrillation (HCC)   . Postmenopausal   . Sinus bradycardia    Past Surgical History:  Procedure Laterality Date  . APPENDECTOMY    . BUNIONECTOMY    . ELECTROPHYSIOLOGIC STUDY N/A 06/23/2016   PVI and CTI by Dr Rayann Heman  . LAMINECTOMY    . TONSILLECTOMY      ROS- all systems are reviewed and negatives except as per HPI above  Current Outpatient Prescriptions  Medication Sig Dispense Refill  . diazepam (VALIUM) 10 MG tablet Take 5 mg by mouth daily as needed (for muscle spasms).   0  . diltiazem (CARDIZEM) 60 MG tablet Take 1 tablet (60 mg total) by mouth daily as needed (heart rate). 30 tablet 3  . docusate sodium (COLACE) 100 MG capsule Take 100 mg by mouth 2 (two) times daily.    Marland Kitchen ELIDEL 1 % cream Apply 1 application topically daily.    Marland Kitchen HYDROcodone-acetaminophen (NORCO/VICODIN) 5-325 MG per tablet Take 1-2 tablets by mouth every 6 (six) hours as needed for moderate pain.   0  . hydroxychloroquine (PLAQUENIL) 200 MG tablet Take 200 mg by mouth 2 (two) times daily.  0  . latanoprost (XALATAN) 0.005 % ophthalmic solution Place 1 drop into both eyes as directed.    . metoprolol succinate (TOPROL-XL) 50 MG 24 hr tablet Take 1  tablet (50 mg total) by mouth daily. 90 tablet 3  . Misc Natural Products (TART CHERRY ADVANCED PO) Take 3 capsules by mouth daily.    . Multiple Vitamin (MULTIVITAMIN) capsule Take 1 capsule by mouth daily.    . NON FORMULARY Take 1 tablet by mouth daily. PROTANDIM antioxidant    . omeprazole (PRILOSEC) 40 MG capsule Take 40 mg by mouth daily as needed (heartburn/acid reflux).    . ondansetron (ZOFRAN) 8 MG tablet Take 8 mg by mouth as needed for nausea (1/2 hour before pain meds).    . predniSONE (DELTASONE) 5 MG tablet Take 5 mg by mouth 2 (two) times daily. temporary  0  . traMADol (ULTRAM) 50 MG tablet Take 50 mg by mouth every 6 (six) hours as needed for moderate pain.   0   No current facility-administered medications for this visit.     Physical Exam: Vitals:   05/03/17 1202  BP: 118/72  Pulse: (!) 57  SpO2: 97%  Weight: 181 lb 12.8 oz (82.5 kg)  Height: 5\' 3"  (1.6 m)    GEN- The patient is well appearing, alert and oriented x 3 today.   Head- normocephalic, atraumatic Eyes-  Sclera clear, conjunctiva pink Ears- hearing intact Oropharynx- clear Lungs- Clear to ausculation bilaterally, normal work of breathing Heart- Regular rate and rhythm, no murmurs, rubs or  gallops, PMI not laterally displaced GI- soft, NT, ND, + BS Extremities- no clubbing, cyanosis, or edema  ekg today reveals sinus rhythm, normal ekg  Assessment and Plan:  1. Paroxysmal atrial fibrillation Maintaining sinus rhythm post ablation off of AAD therapy chads2vasc score is 1.  She does not wish to take anticoagulation She takes additional metoprolol when on higher doses of prednisone and feels less anxious with beta receptor blockade.  2. Overweight She is trying to exercise but is limited by back pain  3. Myalgias/ arthralgias Present since 2014 Followed by rheumatology  Return to see me in 6 months   Thompson Grayer MD, [facility] 05/03/2017 12:44 PM

## 2017-05-03 NOTE — Patient Instructions (Signed)

## 2017-05-04 ENCOUNTER — Other Ambulatory Visit: Payer: Self-pay | Admitting: Family Medicine

## 2017-05-04 ENCOUNTER — Other Ambulatory Visit (HOSPITAL_COMMUNITY)
Admission: RE | Admit: 2017-05-04 | Discharge: 2017-05-04 | Disposition: A | Discharge status: Home / Self Care | Payer: BLUE CROSS/BLUE SHIELD | Source: Ambulatory Visit | Attending: Family Medicine | Admitting: Family Medicine

## 2017-05-04 DIAGNOSIS — E669 Obesity, unspecified: Secondary | ICD-10-CM | POA: Diagnosis not present

## 2017-05-04 DIAGNOSIS — Z124 Encounter for screening for malignant neoplasm of cervix: Secondary | ICD-10-CM | POA: Diagnosis not present

## 2017-05-04 DIAGNOSIS — Z0000 Encounter for general adult medical examination without abnormal findings: Secondary | ICD-10-CM | POA: Diagnosis not present

## 2017-05-04 DIAGNOSIS — R7301 Impaired fasting glucose: Secondary | ICD-10-CM | POA: Diagnosis not present

## 2017-05-04 DIAGNOSIS — E782 Mixed hyperlipidemia: Secondary | ICD-10-CM | POA: Diagnosis not present

## 2017-05-05 LAB — CYTOLOGY - PAP: Diagnosis: NEGATIVE

## 2017-05-27 DIAGNOSIS — M359 Systemic involvement of connective tissue, unspecified: Secondary | ICD-10-CM | POA: Diagnosis not present

## 2017-05-27 DIAGNOSIS — M5136 Other intervertebral disc degeneration, lumbar region: Secondary | ICD-10-CM | POA: Diagnosis not present

## 2017-06-24 DIAGNOSIS — M8588 Other specified disorders of bone density and structure, other site: Secondary | ICD-10-CM | POA: Diagnosis not present

## 2017-07-08 DIAGNOSIS — M47816 Spondylosis without myelopathy or radiculopathy, lumbar region: Secondary | ICD-10-CM | POA: Diagnosis not present

## 2017-07-08 DIAGNOSIS — R030 Elevated blood-pressure reading, without diagnosis of hypertension: Secondary | ICD-10-CM | POA: Diagnosis not present

## 2017-07-08 DIAGNOSIS — Z6833 Body mass index (BMI) 33.0-33.9, adult: Secondary | ICD-10-CM | POA: Diagnosis not present

## 2017-07-14 DIAGNOSIS — F322 Major depressive disorder, single episode, severe without psychotic features: Secondary | ICD-10-CM | POA: Diagnosis not present

## 2017-07-19 DIAGNOSIS — F321 Major depressive disorder, single episode, moderate: Secondary | ICD-10-CM | POA: Diagnosis not present

## 2017-07-26 DIAGNOSIS — F321 Major depressive disorder, single episode, moderate: Secondary | ICD-10-CM | POA: Diagnosis not present

## 2017-08-02 DIAGNOSIS — F321 Major depressive disorder, single episode, moderate: Secondary | ICD-10-CM | POA: Diagnosis not present

## 2017-08-05 DIAGNOSIS — H401131 Primary open-angle glaucoma, bilateral, mild stage: Secondary | ICD-10-CM | POA: Diagnosis not present

## 2017-08-09 DIAGNOSIS — M359 Systemic involvement of connective tissue, unspecified: Secondary | ICD-10-CM | POA: Diagnosis not present

## 2017-08-09 DIAGNOSIS — M5136 Other intervertebral disc degeneration, lumbar region: Secondary | ICD-10-CM | POA: Diagnosis not present

## 2017-08-09 DIAGNOSIS — M2550 Pain in unspecified joint: Secondary | ICD-10-CM | POA: Diagnosis not present

## 2017-08-10 DIAGNOSIS — F321 Major depressive disorder, single episode, moderate: Secondary | ICD-10-CM | POA: Diagnosis not present

## 2017-08-17 DIAGNOSIS — F321 Major depressive disorder, single episode, moderate: Secondary | ICD-10-CM | POA: Diagnosis not present

## 2017-08-19 DIAGNOSIS — F324 Major depressive disorder, single episode, in partial remission: Secondary | ICD-10-CM | POA: Diagnosis not present

## 2017-08-23 DIAGNOSIS — F321 Major depressive disorder, single episode, moderate: Secondary | ICD-10-CM | POA: Diagnosis not present

## 2017-08-24 DIAGNOSIS — L814 Other melanin hyperpigmentation: Secondary | ICD-10-CM | POA: Diagnosis not present

## 2017-08-24 DIAGNOSIS — D225 Melanocytic nevi of trunk: Secondary | ICD-10-CM | POA: Diagnosis not present

## 2017-08-24 DIAGNOSIS — L821 Other seborrheic keratosis: Secondary | ICD-10-CM | POA: Diagnosis not present

## 2017-08-24 DIAGNOSIS — K130 Diseases of lips: Secondary | ICD-10-CM | POA: Diagnosis not present

## 2017-08-30 DIAGNOSIS — F321 Major depressive disorder, single episode, moderate: Secondary | ICD-10-CM | POA: Diagnosis not present

## 2017-09-06 DIAGNOSIS — F321 Major depressive disorder, single episode, moderate: Secondary | ICD-10-CM | POA: Diagnosis not present

## 2017-09-13 DIAGNOSIS — F321 Major depressive disorder, single episode, moderate: Secondary | ICD-10-CM | POA: Diagnosis not present

## 2017-09-21 DIAGNOSIS — F321 Major depressive disorder, single episode, moderate: Secondary | ICD-10-CM | POA: Diagnosis not present

## 2017-09-27 DIAGNOSIS — F321 Major depressive disorder, single episode, moderate: Secondary | ICD-10-CM | POA: Diagnosis not present

## 2017-10-04 DIAGNOSIS — F321 Major depressive disorder, single episode, moderate: Secondary | ICD-10-CM | POA: Diagnosis not present

## 2017-10-06 DIAGNOSIS — M47816 Spondylosis without myelopathy or radiculopathy, lumbar region: Secondary | ICD-10-CM | POA: Diagnosis not present

## 2017-10-06 DIAGNOSIS — R030 Elevated blood-pressure reading, without diagnosis of hypertension: Secondary | ICD-10-CM | POA: Diagnosis not present

## 2017-10-06 DIAGNOSIS — M549 Dorsalgia, unspecified: Secondary | ICD-10-CM | POA: Diagnosis not present

## 2017-10-06 DIAGNOSIS — Z6832 Body mass index (BMI) 32.0-32.9, adult: Secondary | ICD-10-CM | POA: Diagnosis not present

## 2017-10-19 DIAGNOSIS — Z23 Encounter for immunization: Secondary | ICD-10-CM | POA: Diagnosis not present

## 2017-10-19 DIAGNOSIS — F325 Major depressive disorder, single episode, in full remission: Secondary | ICD-10-CM | POA: Diagnosis not present

## 2017-10-19 DIAGNOSIS — M359 Systemic involvement of connective tissue, unspecified: Secondary | ICD-10-CM | POA: Diagnosis not present

## 2017-10-19 DIAGNOSIS — G4700 Insomnia, unspecified: Secondary | ICD-10-CM | POA: Diagnosis not present

## 2017-10-25 DIAGNOSIS — F321 Major depressive disorder, single episode, moderate: Secondary | ICD-10-CM | POA: Diagnosis not present

## 2017-11-01 DIAGNOSIS — F321 Major depressive disorder, single episode, moderate: Secondary | ICD-10-CM | POA: Diagnosis not present

## 2017-11-10 DIAGNOSIS — M9903 Segmental and somatic dysfunction of lumbar region: Secondary | ICD-10-CM | POA: Diagnosis not present

## 2017-11-10 DIAGNOSIS — M2550 Pain in unspecified joint: Secondary | ICD-10-CM | POA: Diagnosis not present

## 2017-11-10 DIAGNOSIS — Q72812 Congenital shortening of left lower limb: Secondary | ICD-10-CM | POA: Diagnosis not present

## 2017-11-10 DIAGNOSIS — M9905 Segmental and somatic dysfunction of pelvic region: Secondary | ICD-10-CM | POA: Diagnosis not present

## 2017-11-10 DIAGNOSIS — M359 Systemic involvement of connective tissue, unspecified: Secondary | ICD-10-CM | POA: Diagnosis not present

## 2017-11-10 DIAGNOSIS — M5127 Other intervertebral disc displacement, lumbosacral region: Secondary | ICD-10-CM | POA: Diagnosis not present

## 2017-11-10 DIAGNOSIS — M5136 Other intervertebral disc degeneration, lumbar region: Secondary | ICD-10-CM | POA: Diagnosis not present

## 2017-11-11 DIAGNOSIS — M9903 Segmental and somatic dysfunction of lumbar region: Secondary | ICD-10-CM | POA: Diagnosis not present

## 2017-11-11 DIAGNOSIS — M9905 Segmental and somatic dysfunction of pelvic region: Secondary | ICD-10-CM | POA: Diagnosis not present

## 2017-11-11 DIAGNOSIS — Q72812 Congenital shortening of left lower limb: Secondary | ICD-10-CM | POA: Diagnosis not present

## 2017-11-11 DIAGNOSIS — M5127 Other intervertebral disc displacement, lumbosacral region: Secondary | ICD-10-CM | POA: Diagnosis not present

## 2017-11-12 DIAGNOSIS — Q72812 Congenital shortening of left lower limb: Secondary | ICD-10-CM | POA: Diagnosis not present

## 2017-11-12 DIAGNOSIS — M9905 Segmental and somatic dysfunction of pelvic region: Secondary | ICD-10-CM | POA: Diagnosis not present

## 2017-11-12 DIAGNOSIS — M9903 Segmental and somatic dysfunction of lumbar region: Secondary | ICD-10-CM | POA: Diagnosis not present

## 2017-11-12 DIAGNOSIS — M5127 Other intervertebral disc displacement, lumbosacral region: Secondary | ICD-10-CM | POA: Diagnosis not present

## 2017-11-15 DIAGNOSIS — H401131 Primary open-angle glaucoma, bilateral, mild stage: Secondary | ICD-10-CM | POA: Diagnosis not present

## 2017-11-15 DIAGNOSIS — F321 Major depressive disorder, single episode, moderate: Secondary | ICD-10-CM | POA: Diagnosis not present

## 2017-11-16 DIAGNOSIS — M5127 Other intervertebral disc displacement, lumbosacral region: Secondary | ICD-10-CM | POA: Diagnosis not present

## 2017-11-16 DIAGNOSIS — M9903 Segmental and somatic dysfunction of lumbar region: Secondary | ICD-10-CM | POA: Diagnosis not present

## 2017-11-16 DIAGNOSIS — M9905 Segmental and somatic dysfunction of pelvic region: Secondary | ICD-10-CM | POA: Diagnosis not present

## 2017-11-16 DIAGNOSIS — Q72812 Congenital shortening of left lower limb: Secondary | ICD-10-CM | POA: Diagnosis not present

## 2017-11-18 DIAGNOSIS — M5127 Other intervertebral disc displacement, lumbosacral region: Secondary | ICD-10-CM | POA: Diagnosis not present

## 2017-11-18 DIAGNOSIS — Q72812 Congenital shortening of left lower limb: Secondary | ICD-10-CM | POA: Diagnosis not present

## 2017-11-18 DIAGNOSIS — M9905 Segmental and somatic dysfunction of pelvic region: Secondary | ICD-10-CM | POA: Diagnosis not present

## 2017-11-18 DIAGNOSIS — M9903 Segmental and somatic dysfunction of lumbar region: Secondary | ICD-10-CM | POA: Diagnosis not present

## 2017-11-24 DIAGNOSIS — M9905 Segmental and somatic dysfunction of pelvic region: Secondary | ICD-10-CM | POA: Diagnosis not present

## 2017-11-24 DIAGNOSIS — M5127 Other intervertebral disc displacement, lumbosacral region: Secondary | ICD-10-CM | POA: Diagnosis not present

## 2017-11-24 DIAGNOSIS — M9903 Segmental and somatic dysfunction of lumbar region: Secondary | ICD-10-CM | POA: Diagnosis not present

## 2017-11-24 DIAGNOSIS — Q72812 Congenital shortening of left lower limb: Secondary | ICD-10-CM | POA: Diagnosis not present

## 2017-11-25 DIAGNOSIS — M9905 Segmental and somatic dysfunction of pelvic region: Secondary | ICD-10-CM | POA: Diagnosis not present

## 2017-11-25 DIAGNOSIS — Q72812 Congenital shortening of left lower limb: Secondary | ICD-10-CM | POA: Diagnosis not present

## 2017-11-25 DIAGNOSIS — M9903 Segmental and somatic dysfunction of lumbar region: Secondary | ICD-10-CM | POA: Diagnosis not present

## 2017-11-25 DIAGNOSIS — M5127 Other intervertebral disc displacement, lumbosacral region: Secondary | ICD-10-CM | POA: Diagnosis not present

## 2017-11-29 DIAGNOSIS — M9905 Segmental and somatic dysfunction of pelvic region: Secondary | ICD-10-CM | POA: Diagnosis not present

## 2017-11-29 DIAGNOSIS — M5127 Other intervertebral disc displacement, lumbosacral region: Secondary | ICD-10-CM | POA: Diagnosis not present

## 2017-11-29 DIAGNOSIS — F321 Major depressive disorder, single episode, moderate: Secondary | ICD-10-CM | POA: Diagnosis not present

## 2017-11-29 DIAGNOSIS — M9903 Segmental and somatic dysfunction of lumbar region: Secondary | ICD-10-CM | POA: Diagnosis not present

## 2017-11-29 DIAGNOSIS — Q72812 Congenital shortening of left lower limb: Secondary | ICD-10-CM | POA: Diagnosis not present

## 2017-11-30 DIAGNOSIS — M9903 Segmental and somatic dysfunction of lumbar region: Secondary | ICD-10-CM | POA: Diagnosis not present

## 2017-11-30 DIAGNOSIS — Q72812 Congenital shortening of left lower limb: Secondary | ICD-10-CM | POA: Diagnosis not present

## 2017-11-30 DIAGNOSIS — M5127 Other intervertebral disc displacement, lumbosacral region: Secondary | ICD-10-CM | POA: Diagnosis not present

## 2017-11-30 DIAGNOSIS — M9905 Segmental and somatic dysfunction of pelvic region: Secondary | ICD-10-CM | POA: Diagnosis not present

## 2017-12-02 DIAGNOSIS — Q72812 Congenital shortening of left lower limb: Secondary | ICD-10-CM | POA: Diagnosis not present

## 2017-12-02 DIAGNOSIS — M9903 Segmental and somatic dysfunction of lumbar region: Secondary | ICD-10-CM | POA: Diagnosis not present

## 2017-12-02 DIAGNOSIS — M9905 Segmental and somatic dysfunction of pelvic region: Secondary | ICD-10-CM | POA: Diagnosis not present

## 2017-12-02 DIAGNOSIS — M5127 Other intervertebral disc displacement, lumbosacral region: Secondary | ICD-10-CM | POA: Diagnosis not present

## 2017-12-10 DIAGNOSIS — M9905 Segmental and somatic dysfunction of pelvic region: Secondary | ICD-10-CM | POA: Diagnosis not present

## 2017-12-10 DIAGNOSIS — M9903 Segmental and somatic dysfunction of lumbar region: Secondary | ICD-10-CM | POA: Diagnosis not present

## 2017-12-10 DIAGNOSIS — M5127 Other intervertebral disc displacement, lumbosacral region: Secondary | ICD-10-CM | POA: Diagnosis not present

## 2017-12-10 DIAGNOSIS — Q72812 Congenital shortening of left lower limb: Secondary | ICD-10-CM | POA: Diagnosis not present

## 2017-12-13 DIAGNOSIS — F321 Major depressive disorder, single episode, moderate: Secondary | ICD-10-CM | POA: Diagnosis not present

## 2017-12-15 DIAGNOSIS — Q72812 Congenital shortening of left lower limb: Secondary | ICD-10-CM | POA: Diagnosis not present

## 2017-12-15 DIAGNOSIS — M9905 Segmental and somatic dysfunction of pelvic region: Secondary | ICD-10-CM | POA: Diagnosis not present

## 2017-12-15 DIAGNOSIS — M5127 Other intervertebral disc displacement, lumbosacral region: Secondary | ICD-10-CM | POA: Diagnosis not present

## 2017-12-15 DIAGNOSIS — M9903 Segmental and somatic dysfunction of lumbar region: Secondary | ICD-10-CM | POA: Diagnosis not present

## 2017-12-16 DIAGNOSIS — M9905 Segmental and somatic dysfunction of pelvic region: Secondary | ICD-10-CM | POA: Diagnosis not present

## 2017-12-16 DIAGNOSIS — M5127 Other intervertebral disc displacement, lumbosacral region: Secondary | ICD-10-CM | POA: Diagnosis not present

## 2017-12-16 DIAGNOSIS — Q72812 Congenital shortening of left lower limb: Secondary | ICD-10-CM | POA: Diagnosis not present

## 2017-12-16 DIAGNOSIS — M9903 Segmental and somatic dysfunction of lumbar region: Secondary | ICD-10-CM | POA: Diagnosis not present

## 2017-12-20 DIAGNOSIS — F321 Major depressive disorder, single episode, moderate: Secondary | ICD-10-CM | POA: Diagnosis not present

## 2017-12-21 DIAGNOSIS — M5127 Other intervertebral disc displacement, lumbosacral region: Secondary | ICD-10-CM | POA: Diagnosis not present

## 2017-12-21 DIAGNOSIS — Q72812 Congenital shortening of left lower limb: Secondary | ICD-10-CM | POA: Diagnosis not present

## 2017-12-21 DIAGNOSIS — M9903 Segmental and somatic dysfunction of lumbar region: Secondary | ICD-10-CM | POA: Diagnosis not present

## 2017-12-21 DIAGNOSIS — M9905 Segmental and somatic dysfunction of pelvic region: Secondary | ICD-10-CM | POA: Diagnosis not present

## 2017-12-23 DIAGNOSIS — M9905 Segmental and somatic dysfunction of pelvic region: Secondary | ICD-10-CM | POA: Diagnosis not present

## 2017-12-23 DIAGNOSIS — Q72812 Congenital shortening of left lower limb: Secondary | ICD-10-CM | POA: Diagnosis not present

## 2017-12-23 DIAGNOSIS — M5127 Other intervertebral disc displacement, lumbosacral region: Secondary | ICD-10-CM | POA: Diagnosis not present

## 2017-12-23 DIAGNOSIS — M9903 Segmental and somatic dysfunction of lumbar region: Secondary | ICD-10-CM | POA: Diagnosis not present

## 2017-12-27 DIAGNOSIS — F321 Major depressive disorder, single episode, moderate: Secondary | ICD-10-CM | POA: Diagnosis not present

## 2018-01-03 DIAGNOSIS — F321 Major depressive disorder, single episode, moderate: Secondary | ICD-10-CM | POA: Diagnosis not present

## 2018-01-04 DIAGNOSIS — R030 Elevated blood-pressure reading, without diagnosis of hypertension: Secondary | ICD-10-CM | POA: Diagnosis not present

## 2018-01-04 DIAGNOSIS — Z6833 Body mass index (BMI) 33.0-33.9, adult: Secondary | ICD-10-CM | POA: Diagnosis not present

## 2018-01-04 DIAGNOSIS — G8929 Other chronic pain: Secondary | ICD-10-CM | POA: Diagnosis not present

## 2018-01-04 DIAGNOSIS — M47816 Spondylosis without myelopathy or radiculopathy, lumbar region: Secondary | ICD-10-CM | POA: Diagnosis not present

## 2018-01-10 DIAGNOSIS — F321 Major depressive disorder, single episode, moderate: Secondary | ICD-10-CM | POA: Diagnosis not present

## 2018-01-17 DIAGNOSIS — F321 Major depressive disorder, single episode, moderate: Secondary | ICD-10-CM | POA: Diagnosis not present

## 2018-01-24 DIAGNOSIS — F321 Major depressive disorder, single episode, moderate: Secondary | ICD-10-CM | POA: Diagnosis not present

## 2018-01-31 DIAGNOSIS — F321 Major depressive disorder, single episode, moderate: Secondary | ICD-10-CM | POA: Diagnosis not present

## 2018-02-07 DIAGNOSIS — F321 Major depressive disorder, single episode, moderate: Secondary | ICD-10-CM | POA: Diagnosis not present

## 2018-02-14 DIAGNOSIS — F321 Major depressive disorder, single episode, moderate: Secondary | ICD-10-CM | POA: Diagnosis not present

## 2018-02-21 DIAGNOSIS — F321 Major depressive disorder, single episode, moderate: Secondary | ICD-10-CM | POA: Diagnosis not present

## 2018-02-28 DIAGNOSIS — F321 Major depressive disorder, single episode, moderate: Secondary | ICD-10-CM | POA: Diagnosis not present

## 2018-03-07 DIAGNOSIS — F321 Major depressive disorder, single episode, moderate: Secondary | ICD-10-CM | POA: Diagnosis not present

## 2018-03-21 DIAGNOSIS — F321 Major depressive disorder, single episode, moderate: Secondary | ICD-10-CM | POA: Diagnosis not present

## 2018-03-22 ENCOUNTER — Other Ambulatory Visit: Payer: Self-pay | Admitting: Family Medicine

## 2018-03-22 DIAGNOSIS — G8929 Other chronic pain: Secondary | ICD-10-CM | POA: Diagnosis not present

## 2018-03-22 DIAGNOSIS — M47816 Spondylosis without myelopathy or radiculopathy, lumbar region: Secondary | ICD-10-CM | POA: Diagnosis not present

## 2018-03-22 DIAGNOSIS — Z1231 Encounter for screening mammogram for malignant neoplasm of breast: Secondary | ICD-10-CM

## 2018-03-23 ENCOUNTER — Other Ambulatory Visit: Payer: Self-pay | Admitting: *Deleted

## 2018-03-23 MED ORDER — METOPROLOL SUCCINATE ER 50 MG PO TB24: 50.0000 mg | ORAL_TABLET | Freq: Every day | ORAL | 0 refills | Status: DC

## 2018-03-24 MED ORDER — METOPROLOL SUCCINATE ER 50 MG PO TB24: 50.0000 mg | ORAL_TABLET | Freq: Every day | ORAL | 0 refills | Status: DC

## 2018-03-24 NOTE — Addendum Note (Signed)
Addended by: Juventino Slovak on: 03/24/2018 02:31 PM   Modules accepted: Orders

## 2018-03-28 DIAGNOSIS — F321 Major depressive disorder, single episode, moderate: Secondary | ICD-10-CM | POA: Diagnosis not present

## 2018-04-04 DIAGNOSIS — F321 Major depressive disorder, single episode, moderate: Secondary | ICD-10-CM | POA: Diagnosis not present

## 2018-04-05 DIAGNOSIS — H401131 Primary open-angle glaucoma, bilateral, mild stage: Secondary | ICD-10-CM | POA: Diagnosis not present

## 2018-04-05 DIAGNOSIS — H2513 Age-related nuclear cataract, bilateral: Secondary | ICD-10-CM | POA: Diagnosis not present

## 2018-04-11 DIAGNOSIS — F321 Major depressive disorder, single episode, moderate: Secondary | ICD-10-CM | POA: Diagnosis not present

## 2018-04-13 ENCOUNTER — Ambulatory Visit
Admission: RE | Admit: 2018-04-13 | Discharge: 2018-04-13 | Disposition: A | Discharge status: Home / Self Care | Payer: BLUE CROSS/BLUE SHIELD | Source: Ambulatory Visit | Attending: Family Medicine | Admitting: Family Medicine

## 2018-04-13 DIAGNOSIS — Z1231 Encounter for screening mammogram for malignant neoplasm of breast: Secondary | ICD-10-CM

## 2018-04-18 DIAGNOSIS — F321 Major depressive disorder, single episode, moderate: Secondary | ICD-10-CM | POA: Diagnosis not present

## 2018-04-26 ENCOUNTER — Encounter: Payer: Self-pay | Admitting: Internal Medicine

## 2018-05-02 DIAGNOSIS — F321 Major depressive disorder, single episode, moderate: Secondary | ICD-10-CM | POA: Diagnosis not present

## 2018-05-09 DIAGNOSIS — F321 Major depressive disorder, single episode, moderate: Secondary | ICD-10-CM | POA: Diagnosis not present

## 2018-05-10 DIAGNOSIS — M2550 Pain in unspecified joint: Secondary | ICD-10-CM | POA: Diagnosis not present

## 2018-05-10 DIAGNOSIS — M5136 Other intervertebral disc degeneration, lumbar region: Secondary | ICD-10-CM | POA: Diagnosis not present

## 2018-05-10 DIAGNOSIS — M359 Systemic involvement of connective tissue, unspecified: Secondary | ICD-10-CM | POA: Diagnosis not present

## 2018-05-10 DIAGNOSIS — R21 Rash and other nonspecific skin eruption: Secondary | ICD-10-CM | POA: Diagnosis not present

## 2018-05-16 ENCOUNTER — Ambulatory Visit: Payer: BLUE CROSS/BLUE SHIELD | Admitting: Internal Medicine

## 2018-05-23 DIAGNOSIS — F321 Major depressive disorder, single episode, moderate: Secondary | ICD-10-CM | POA: Diagnosis not present

## 2018-05-30 DIAGNOSIS — Z79899 Other long term (current) drug therapy: Secondary | ICD-10-CM | POA: Diagnosis not present

## 2018-05-30 DIAGNOSIS — Z0000 Encounter for general adult medical examination without abnormal findings: Secondary | ICD-10-CM | POA: Diagnosis not present

## 2018-05-30 DIAGNOSIS — Z1211 Encounter for screening for malignant neoplasm of colon: Secondary | ICD-10-CM | POA: Diagnosis not present

## 2018-05-30 DIAGNOSIS — F321 Major depressive disorder, single episode, moderate: Secondary | ICD-10-CM | POA: Diagnosis not present

## 2018-05-30 DIAGNOSIS — F325 Major depressive disorder, single episode, in full remission: Secondary | ICD-10-CM | POA: Diagnosis not present

## 2018-05-30 DIAGNOSIS — R7301 Impaired fasting glucose: Secondary | ICD-10-CM | POA: Diagnosis not present

## 2018-06-06 DIAGNOSIS — F321 Major depressive disorder, single episode, moderate: Secondary | ICD-10-CM | POA: Diagnosis not present

## 2018-06-10 DIAGNOSIS — M25571 Pain in right ankle and joints of right foot: Secondary | ICD-10-CM | POA: Diagnosis not present

## 2018-06-13 DIAGNOSIS — F321 Major depressive disorder, single episode, moderate: Secondary | ICD-10-CM | POA: Diagnosis not present

## 2018-06-14 NOTE — Progress Notes (Signed)
Electrophysiology Office Note Date: 06/15/2018  ID:  Silva, Stacy [birth year], MRN [id number]  PCP: Cari Caraway, MD Electrophysiologist: Rayann Heman  CC: AF follow up  Stacy Silva is a 62 y.o. female seen today for Dr Rayann Heman.  She presents today for routine electrophysiology followup.  Since last being seen in our clinic, the patient reports doing very well.  She has had no recurrence of AF. She denies chest pain, palpitations, dyspnea, PND, orthopnea, nausea, vomiting, dizziness, syncope, edema, weight gain, or early satiety.  Past Medical History:  Diagnosis Date  . DDD (degenerative disc disease)   . Glaucoma   . Hyperlipidemia   . Insomnia   . Myalgia and myositis, unspecified   . Obesity   . Paroxysmal atrial fibrillation (HCC)   . Postmenopausal   . Sinus bradycardia    Past Surgical History:  Procedure Laterality Date  . APPENDECTOMY    . BUNIONECTOMY    . ELECTROPHYSIOLOGIC STUDY N/A 06/23/2016   PVI and CTI by Dr Rayann Heman  . LAMINECTOMY    . TONSILLECTOMY      Current Outpatient Medications  Medication Sig Dispense Refill  . diltiazem (CARDIZEM) 60 MG tablet Take 1 tablet (60 mg total) by mouth daily as needed (heart rate). 30 tablet 3  . docusate sodium (COLACE) 100 MG capsule Take 100 mg by mouth 2 (two) times daily.    Marland Kitchen ELIDEL 1 % cream Apply 1 application topically daily.    Marland Kitchen HYDROcodone-acetaminophen (NORCO/VICODIN) 5-325 MG per tablet Take 1-2 tablets by mouth every 6 (six) hours as needed for moderate pain.   0  . hydroxychloroquine (PLAQUENIL) 200 MG tablet Take 200 mg by mouth 2 (two) times daily.  0  . latanoprost (XALATAN) 0.005 % ophthalmic solution Place 1 drop into both eyes as directed.    . metoprolol succinate (TOPROL-XL) 50 MG 24 hr tablet Take 1 tablet (50 mg total) by mouth daily. 90 tablet 2  . Misc Natural Products (TART CHERRY ADVANCED PO) Take 3 capsules by mouth daily.    . Multiple Vitamin (MULTIVITAMIN) capsule Take 1  capsule by mouth daily.    . NON FORMULARY Take 1 tablet by mouth daily. PROTANDIM antioxidant    . ondansetron (ZOFRAN) 8 MG tablet Take 8 mg by mouth as needed for nausea (1/2 hour before pain meds).    Marland Kitchen tiZANidine (ZANAFLEX) 4 MG tablet Take 4 mg by mouth as directed.    . traMADol (ULTRAM) 50 MG tablet Take 50 mg by mouth every 6 (six) hours as needed for moderate pain.   0   No current facility-administered medications for this visit.     Allergies:   Erythromycin; Pravastatin; Amoxil [amoxicillin]; and Tape   Social History: Social History   Socioeconomic History  . Marital status: Married    Spouse name: Not on file  . Number of children: Not on file  . Years of education: Not on file  . Highest education level: Not on file  Occupational History  . Not on file  Social Needs  . Financial resource strain: Not on file  . Food insecurity:    Worry: Not on file    Inability: Not on file  . Transportation needs:    Medical: Not on file    Non-medical: Not on file  Tobacco Use  . Smoking status: Former Research scientist (life sciences)  . Smokeless tobacco: Never Used  . Tobacco comment: PATIENT QUIT ON 10/ 26/2010. 30 yr history  Substance and  Sexual Activity  . Alcohol use: No    Alcohol/week: 0.0 oz  . Drug use: No  . Sexual activity: Not on file  Lifestyle  . Physical activity:    Days per week: Not on file    Minutes per session: Not on file  . Stress: Not on file  Relationships  . Social connections:    Talks on phone: Not on file    Gets together: Not on file    Attends religious service: Not on file    Active member of club or organization: Not on file    Attends meetings of clubs or organizations: Not on file    Relationship status: Not on file  . Intimate partner violence:    Fear of current or ex partner: Not on file    Emotionally abused: Not on file    Physically abused: Not on file    Forced sexual activity: Not on file  Other Topics Concern  . Not on file  Social  History Narrative   Pt lives in [place] with spouse.  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 Heart attack Maternal Grandfather        DECEASED  . Congestive Heart Failure Father        DECEASD  . Heart attack Father        DECEASED  . CAD Father        CABG age 38  . Heart attack Paternal Grandfather        DECEASED  . Breast cancer Maternal Grandmother     Review of Systems: All other systems reviewed and are otherwise negative except as noted above.   Physical Exam: VS:  BP 110/68   Pulse 64   Ht 5\' 3"  (1.6 m)   Wt 194 lb 3.2 oz (88.1 kg)   BMI 34.40 kg/m  , BMI Body mass index is 34.4 kg/m. Wt Readings from Last 3 Encounters:  06/15/18 194 lb 3.2 oz (88.1 kg)  05/03/17 181 lb 12.8 oz (82.5 kg)  01/27/17 188 lb (85.3 kg)    GEN- The patient is well appearing, alert and oriented x 3 today.   HEENT: normocephalic, atraumatic; sclera clear, conjunctiva pink; hearing intact; oropharynx clear; neck supple  Lungs- Clear to ausculation bilaterally, normal work of breathing.  No wheezes, rales, rhonchi Heart- Regular rate and rhythm  GI- soft, non-tender, non-distended, bowel sounds present  Extremities- no clubbing, cyanosis, or edema  MS- no significant deformity or atrophy Skin- warm and dry, no rash or lesion  Psych- euthymic mood, full affect Neuro- strength and sensation are intact   EKG:  EKG is ordered today. The ekg ordered today shows sinus rhythm, rate 64, normal intervals  Recent Labs: No results found for requested labs within last 8760 hours.    Other studies Reviewed: Additional studies/ records that were reviewed today include: Dr Jackalyn Lombard office notes  Assessment and Plan: 1.  Paroxysmal atrial fibrillation No recurrence CHADS2VASC is 1  2.  Obesity Body mass index is 34.4 kg/m. Weight loss encouraged    Current medicines are reviewed at  length with the patient today.   The patient does not have concerns regarding her medicines.  The following changes were made today:  none  Labs/ tests ordered today include: none Orders Placed This Encounter  Procedures  . EKG 12-Lead     Disposition:   Follow up with Dr  Allred 1 year      Signed, Chanetta Marshall, NP 06/15/2018 8:52 AM   [place] [address] [phone] (office) [phone] (fax)

## 2018-06-15 ENCOUNTER — Encounter: Payer: Self-pay | Admitting: Nurse Practitioner

## 2018-06-15 ENCOUNTER — Ambulatory Visit (INDEPENDENT_AMBULATORY_CARE_PROVIDER_SITE_OTHER): Payer: BLUE CROSS/BLUE SHIELD | Admitting: Nurse Practitioner

## 2018-06-15 VITALS — BP 110/68 | HR 64 | Ht 63.0 in | Wt 194.2 lb

## 2018-06-15 DIAGNOSIS — Z6834 Body mass index (BMI) 34.0-34.9, adult: Secondary | ICD-10-CM | POA: Diagnosis not present

## 2018-06-15 DIAGNOSIS — G8929 Other chronic pain: Secondary | ICD-10-CM | POA: Diagnosis not present

## 2018-06-15 DIAGNOSIS — I480 Paroxysmal atrial fibrillation: Secondary | ICD-10-CM | POA: Diagnosis not present

## 2018-06-15 DIAGNOSIS — M47816 Spondylosis without myelopathy or radiculopathy, lumbar region: Secondary | ICD-10-CM | POA: Diagnosis not present

## 2018-06-15 MED ORDER — METOPROLOL SUCCINATE ER 50 MG PO TB24: 50.0000 mg | ORAL_TABLET | Freq: Every day | ORAL | 2 refills | Status: DC

## 2018-06-15 NOTE — Patient Instructions (Signed)
Medication Instructions:   Your physician recommends that you continue on your current medications as directed. Please refer to the Current Medication list given to you today.    If you need a refill on your cardiac medications before your next appointment, please call your pharmacy.  Labwork: NONE ORDERED  TODAY    Testing/Procedures:  NONE ORDERED  TODAY     Follow-Up: AS SCHEDULED WITH DR Rayann Heman    Any Other Special Instructions Will Be Listed Below (If Applicable).

## 2018-06-20 DIAGNOSIS — F321 Major depressive disorder, single episode, moderate: Secondary | ICD-10-CM | POA: Diagnosis not present

## 2018-06-27 DIAGNOSIS — F321 Major depressive disorder, single episode, moderate: Secondary | ICD-10-CM | POA: Diagnosis not present

## 2018-07-05 DIAGNOSIS — H401131 Primary open-angle glaucoma, bilateral, mild stage: Secondary | ICD-10-CM | POA: Diagnosis not present

## 2018-07-11 DIAGNOSIS — F321 Major depressive disorder, single episode, moderate: Secondary | ICD-10-CM | POA: Diagnosis not present

## 2018-07-18 DIAGNOSIS — F321 Major depressive disorder, single episode, moderate: Secondary | ICD-10-CM | POA: Diagnosis not present

## 2018-07-25 DIAGNOSIS — F321 Major depressive disorder, single episode, moderate: Secondary | ICD-10-CM | POA: Diagnosis not present

## 2018-08-01 DIAGNOSIS — F321 Major depressive disorder, single episode, moderate: Secondary | ICD-10-CM | POA: Diagnosis not present

## 2018-08-08 ENCOUNTER — Ambulatory Visit: Payer: BLUE CROSS/BLUE SHIELD | Admitting: Internal Medicine

## 2018-08-22 DIAGNOSIS — F321 Major depressive disorder, single episode, moderate: Secondary | ICD-10-CM | POA: Diagnosis not present

## 2018-09-05 DIAGNOSIS — F321 Major depressive disorder, single episode, moderate: Secondary | ICD-10-CM | POA: Diagnosis not present

## 2018-09-07 DIAGNOSIS — M47816 Spondylosis without myelopathy or radiculopathy, lumbar region: Secondary | ICD-10-CM | POA: Diagnosis not present

## 2018-09-07 DIAGNOSIS — Z6835 Body mass index (BMI) 35.0-35.9, adult: Secondary | ICD-10-CM | POA: Diagnosis not present

## 2018-09-07 DIAGNOSIS — G8929 Other chronic pain: Secondary | ICD-10-CM | POA: Diagnosis not present

## 2018-09-19 DIAGNOSIS — F321 Major depressive disorder, single episode, moderate: Secondary | ICD-10-CM | POA: Diagnosis not present

## 2018-09-26 DIAGNOSIS — F321 Major depressive disorder, single episode, moderate: Secondary | ICD-10-CM | POA: Diagnosis not present

## 2018-10-10 DIAGNOSIS — F321 Major depressive disorder, single episode, moderate: Secondary | ICD-10-CM | POA: Diagnosis not present

## 2018-10-11 DIAGNOSIS — H401131 Primary open-angle glaucoma, bilateral, mild stage: Secondary | ICD-10-CM | POA: Diagnosis not present

## 2018-10-24 DIAGNOSIS — F321 Major depressive disorder, single episode, moderate: Secondary | ICD-10-CM | POA: Diagnosis not present

## 2018-10-31 DIAGNOSIS — F321 Major depressive disorder, single episode, moderate: Secondary | ICD-10-CM | POA: Diagnosis not present

## 2018-11-08 DIAGNOSIS — R21 Rash and other nonspecific skin eruption: Secondary | ICD-10-CM | POA: Diagnosis not present

## 2018-11-08 DIAGNOSIS — M25562 Pain in left knee: Secondary | ICD-10-CM | POA: Diagnosis not present

## 2018-11-08 DIAGNOSIS — M359 Systemic involvement of connective tissue, unspecified: Secondary | ICD-10-CM | POA: Diagnosis not present

## 2018-11-08 DIAGNOSIS — M2550 Pain in unspecified joint: Secondary | ICD-10-CM | POA: Diagnosis not present

## 2018-11-08 DIAGNOSIS — M5136 Other intervertebral disc degeneration, lumbar region: Secondary | ICD-10-CM | POA: Diagnosis not present

## 2018-11-08 DIAGNOSIS — M25561 Pain in right knee: Secondary | ICD-10-CM | POA: Diagnosis not present

## 2018-11-14 DIAGNOSIS — F321 Major depressive disorder, single episode, moderate: Secondary | ICD-10-CM | POA: Diagnosis not present

## 2018-11-28 DIAGNOSIS — F321 Major depressive disorder, single episode, moderate: Secondary | ICD-10-CM | POA: Diagnosis not present

## 2018-12-02 DIAGNOSIS — R030 Elevated blood-pressure reading, without diagnosis of hypertension: Secondary | ICD-10-CM | POA: Diagnosis not present

## 2018-12-02 DIAGNOSIS — Z6836 Body mass index (BMI) 36.0-36.9, adult: Secondary | ICD-10-CM | POA: Diagnosis not present

## 2018-12-02 DIAGNOSIS — M47816 Spondylosis without myelopathy or radiculopathy, lumbar region: Secondary | ICD-10-CM | POA: Diagnosis not present

## 2018-12-02 DIAGNOSIS — G8929 Other chronic pain: Secondary | ICD-10-CM | POA: Diagnosis not present

## 2018-12-19 DIAGNOSIS — F321 Major depressive disorder, single episode, moderate: Secondary | ICD-10-CM | POA: Diagnosis not present

## 2019-01-02 DIAGNOSIS — F321 Major depressive disorder, single episode, moderate: Secondary | ICD-10-CM | POA: Diagnosis not present

## 2019-01-09 DIAGNOSIS — F321 Major depressive disorder, single episode, moderate: Secondary | ICD-10-CM | POA: Diagnosis not present

## 2019-01-12 DIAGNOSIS — H2513 Age-related nuclear cataract, bilateral: Secondary | ICD-10-CM | POA: Diagnosis not present

## 2019-01-12 DIAGNOSIS — H401131 Primary open-angle glaucoma, bilateral, mild stage: Secondary | ICD-10-CM | POA: Diagnosis not present

## 2019-01-16 DIAGNOSIS — F321 Major depressive disorder, single episode, moderate: Secondary | ICD-10-CM | POA: Diagnosis not present

## 2019-01-23 DIAGNOSIS — F321 Major depressive disorder, single episode, moderate: Secondary | ICD-10-CM | POA: Diagnosis not present

## 2019-01-30 DIAGNOSIS — F321 Major depressive disorder, single episode, moderate: Secondary | ICD-10-CM | POA: Diagnosis not present

## 2019-02-06 DIAGNOSIS — F321 Major depressive disorder, single episode, moderate: Secondary | ICD-10-CM | POA: Diagnosis not present

## 2019-02-13 DIAGNOSIS — F321 Major depressive disorder, single episode, moderate: Secondary | ICD-10-CM | POA: Diagnosis not present

## 2019-02-20 DIAGNOSIS — F321 Major depressive disorder, single episode, moderate: Secondary | ICD-10-CM | POA: Diagnosis not present

## 2019-02-22 DIAGNOSIS — G8929 Other chronic pain: Secondary | ICD-10-CM | POA: Diagnosis not present

## 2019-02-22 DIAGNOSIS — M47816 Spondylosis without myelopathy or radiculopathy, lumbar region: Secondary | ICD-10-CM | POA: Diagnosis not present

## 2019-02-22 DIAGNOSIS — I10 Essential (primary) hypertension: Secondary | ICD-10-CM | POA: Diagnosis not present

## 2019-02-22 DIAGNOSIS — Z6836 Body mass index (BMI) 36.0-36.9, adult: Secondary | ICD-10-CM | POA: Diagnosis not present

## 2019-02-27 DIAGNOSIS — F321 Major depressive disorder, single episode, moderate: Secondary | ICD-10-CM | POA: Diagnosis not present

## 2019-03-06 DIAGNOSIS — F321 Major depressive disorder, single episode, moderate: Secondary | ICD-10-CM | POA: Diagnosis not present

## 2019-03-20 DIAGNOSIS — F321 Major depressive disorder, single episode, moderate: Secondary | ICD-10-CM | POA: Diagnosis not present

## 2019-03-24 ENCOUNTER — Other Ambulatory Visit: Payer: Self-pay | Admitting: Nurse Practitioner

## 2019-03-27 DIAGNOSIS — F321 Major depressive disorder, single episode, moderate: Secondary | ICD-10-CM | POA: Diagnosis not present

## 2019-04-03 DIAGNOSIS — F321 Major depressive disorder, single episode, moderate: Secondary | ICD-10-CM | POA: Diagnosis not present

## 2019-04-10 DIAGNOSIS — F321 Major depressive disorder, single episode, moderate: Secondary | ICD-10-CM | POA: Diagnosis not present

## 2019-04-24 DIAGNOSIS — F321 Major depressive disorder, single episode, moderate: Secondary | ICD-10-CM | POA: Diagnosis not present

## 2019-05-01 DIAGNOSIS — F321 Major depressive disorder, single episode, moderate: Secondary | ICD-10-CM | POA: Diagnosis not present

## 2019-05-08 DIAGNOSIS — F321 Major depressive disorder, single episode, moderate: Secondary | ICD-10-CM | POA: Diagnosis not present

## 2019-05-09 DIAGNOSIS — M359 Systemic involvement of connective tissue, unspecified: Secondary | ICD-10-CM | POA: Diagnosis not present

## 2019-05-09 DIAGNOSIS — M5136 Other intervertebral disc degeneration, lumbar region: Secondary | ICD-10-CM | POA: Diagnosis not present

## 2019-05-09 DIAGNOSIS — R21 Rash and other nonspecific skin eruption: Secondary | ICD-10-CM | POA: Diagnosis not present

## 2019-05-09 DIAGNOSIS — M2550 Pain in unspecified joint: Secondary | ICD-10-CM | POA: Diagnosis not present

## 2019-05-15 DIAGNOSIS — F321 Major depressive disorder, single episode, moderate: Secondary | ICD-10-CM | POA: Diagnosis not present

## 2019-05-17 DIAGNOSIS — M359 Systemic involvement of connective tissue, unspecified: Secondary | ICD-10-CM | POA: Diagnosis not present

## 2019-05-22 DIAGNOSIS — F321 Major depressive disorder, single episode, moderate: Secondary | ICD-10-CM | POA: Diagnosis not present

## 2019-05-29 DIAGNOSIS — F321 Major depressive disorder, single episode, moderate: Secondary | ICD-10-CM | POA: Diagnosis not present

## 2019-05-30 DIAGNOSIS — M47816 Spondylosis without myelopathy or radiculopathy, lumbar region: Secondary | ICD-10-CM | POA: Diagnosis not present

## 2019-06-05 DIAGNOSIS — F321 Major depressive disorder, single episode, moderate: Secondary | ICD-10-CM | POA: Diagnosis not present

## 2019-06-12 DIAGNOSIS — F321 Major depressive disorder, single episode, moderate: Secondary | ICD-10-CM | POA: Diagnosis not present

## 2019-06-19 DIAGNOSIS — F321 Major depressive disorder, single episode, moderate: Secondary | ICD-10-CM | POA: Diagnosis not present

## 2019-06-20 DIAGNOSIS — L732 Hidradenitis suppurativa: Secondary | ICD-10-CM | POA: Diagnosis not present

## 2019-06-20 DIAGNOSIS — F325 Major depressive disorder, single episode, in full remission: Secondary | ICD-10-CM | POA: Diagnosis not present

## 2019-06-20 DIAGNOSIS — R21 Rash and other nonspecific skin eruption: Secondary | ICD-10-CM | POA: Diagnosis not present

## 2019-06-20 DIAGNOSIS — M359 Systemic involvement of connective tissue, unspecified: Secondary | ICD-10-CM | POA: Diagnosis not present

## 2019-06-26 DIAGNOSIS — F321 Major depressive disorder, single episode, moderate: Secondary | ICD-10-CM | POA: Diagnosis not present

## 2019-07-03 DIAGNOSIS — F321 Major depressive disorder, single episode, moderate: Secondary | ICD-10-CM | POA: Diagnosis not present

## 2019-07-10 DIAGNOSIS — F321 Major depressive disorder, single episode, moderate: Secondary | ICD-10-CM | POA: Diagnosis not present

## 2019-07-24 DIAGNOSIS — F321 Major depressive disorder, single episode, moderate: Secondary | ICD-10-CM | POA: Diagnosis not present

## 2019-07-31 DIAGNOSIS — F321 Major depressive disorder, single episode, moderate: Secondary | ICD-10-CM | POA: Diagnosis not present

## 2019-08-14 DIAGNOSIS — F321 Major depressive disorder, single episode, moderate: Secondary | ICD-10-CM | POA: Diagnosis not present

## 2019-08-16 DIAGNOSIS — Z6837 Body mass index (BMI) 37.0-37.9, adult: Secondary | ICD-10-CM | POA: Diagnosis not present

## 2019-08-16 DIAGNOSIS — M48061 Spinal stenosis, lumbar region without neurogenic claudication: Secondary | ICD-10-CM | POA: Diagnosis not present

## 2019-08-16 DIAGNOSIS — M47816 Spondylosis without myelopathy or radiculopathy, lumbar region: Secondary | ICD-10-CM | POA: Diagnosis not present

## 2019-08-16 DIAGNOSIS — R030 Elevated blood-pressure reading, without diagnosis of hypertension: Secondary | ICD-10-CM | POA: Diagnosis not present

## 2019-08-21 DIAGNOSIS — F321 Major depressive disorder, single episode, moderate: Secondary | ICD-10-CM | POA: Diagnosis not present

## 2019-08-24 ENCOUNTER — Other Ambulatory Visit: Payer: Self-pay | Admitting: Neurosurgery

## 2019-08-24 DIAGNOSIS — M48061 Spinal stenosis, lumbar region without neurogenic claudication: Secondary | ICD-10-CM

## 2019-08-28 DIAGNOSIS — F321 Major depressive disorder, single episode, moderate: Secondary | ICD-10-CM | POA: Diagnosis not present

## 2019-09-04 DIAGNOSIS — F321 Major depressive disorder, single episode, moderate: Secondary | ICD-10-CM | POA: Diagnosis not present

## 2019-09-10 ENCOUNTER — Ambulatory Visit
Admission: RE | Admit: 2019-09-10 | Discharge: 2019-09-10 | Disposition: A | Discharge status: Home / Self Care | Payer: BLUE CROSS/BLUE SHIELD | Source: Ambulatory Visit | Attending: Neurosurgery | Admitting: Neurosurgery

## 2019-09-10 ENCOUNTER — Other Ambulatory Visit: Payer: Self-pay

## 2019-09-10 DIAGNOSIS — M48061 Spinal stenosis, lumbar region without neurogenic claudication: Secondary | ICD-10-CM | POA: Diagnosis not present

## 2019-09-11 DIAGNOSIS — F321 Major depressive disorder, single episode, moderate: Secondary | ICD-10-CM | POA: Diagnosis not present

## 2019-09-18 DIAGNOSIS — F321 Major depressive disorder, single episode, moderate: Secondary | ICD-10-CM | POA: Diagnosis not present

## 2019-09-22 DIAGNOSIS — Z6837 Body mass index (BMI) 37.0-37.9, adult: Secondary | ICD-10-CM | POA: Diagnosis not present

## 2019-09-22 DIAGNOSIS — M47816 Spondylosis without myelopathy or radiculopathy, lumbar region: Secondary | ICD-10-CM | POA: Diagnosis not present

## 2019-09-22 DIAGNOSIS — R030 Elevated blood-pressure reading, without diagnosis of hypertension: Secondary | ICD-10-CM | POA: Diagnosis not present

## 2019-09-22 DIAGNOSIS — M48061 Spinal stenosis, lumbar region without neurogenic claudication: Secondary | ICD-10-CM | POA: Diagnosis not present

## 2019-09-25 DIAGNOSIS — F321 Major depressive disorder, single episode, moderate: Secondary | ICD-10-CM | POA: Diagnosis not present

## 2019-10-02 DIAGNOSIS — F321 Major depressive disorder, single episode, moderate: Secondary | ICD-10-CM | POA: Diagnosis not present

## 2019-10-09 DIAGNOSIS — F321 Major depressive disorder, single episode, moderate: Secondary | ICD-10-CM | POA: Diagnosis not present

## 2019-10-16 DIAGNOSIS — F321 Major depressive disorder, single episode, moderate: Secondary | ICD-10-CM | POA: Diagnosis not present

## 2019-10-23 DIAGNOSIS — F321 Major depressive disorder, single episode, moderate: Secondary | ICD-10-CM | POA: Diagnosis not present

## 2019-10-30 DIAGNOSIS — M47816 Spondylosis without myelopathy or radiculopathy, lumbar region: Secondary | ICD-10-CM | POA: Diagnosis not present

## 2019-11-06 DIAGNOSIS — F321 Major depressive disorder, single episode, moderate: Secondary | ICD-10-CM | POA: Diagnosis not present

## 2019-11-13 DIAGNOSIS — F321 Major depressive disorder, single episode, moderate: Secondary | ICD-10-CM | POA: Diagnosis not present

## 2019-11-15 DIAGNOSIS — M5136 Other intervertebral disc degeneration, lumbar region: Secondary | ICD-10-CM | POA: Diagnosis not present

## 2019-11-15 DIAGNOSIS — M359 Systemic involvement of connective tissue, unspecified: Secondary | ICD-10-CM | POA: Diagnosis not present

## 2019-11-15 DIAGNOSIS — R21 Rash and other nonspecific skin eruption: Secondary | ICD-10-CM | POA: Diagnosis not present

## 2019-11-15 DIAGNOSIS — M2550 Pain in unspecified joint: Secondary | ICD-10-CM | POA: Diagnosis not present

## 2019-11-20 DIAGNOSIS — F321 Major depressive disorder, single episode, moderate: Secondary | ICD-10-CM | POA: Diagnosis not present

## 2019-11-22 DIAGNOSIS — Z6836 Body mass index (BMI) 36.0-36.9, adult: Secondary | ICD-10-CM | POA: Diagnosis not present

## 2019-11-22 DIAGNOSIS — G8929 Other chronic pain: Secondary | ICD-10-CM | POA: Diagnosis not present

## 2019-11-22 DIAGNOSIS — R030 Elevated blood-pressure reading, without diagnosis of hypertension: Secondary | ICD-10-CM | POA: Diagnosis not present

## 2019-11-22 DIAGNOSIS — M47816 Spondylosis without myelopathy or radiculopathy, lumbar region: Secondary | ICD-10-CM | POA: Diagnosis not present

## 2019-11-23 DIAGNOSIS — Z0000 Encounter for general adult medical examination without abnormal findings: Secondary | ICD-10-CM | POA: Diagnosis not present

## 2019-11-23 DIAGNOSIS — F325 Major depressive disorder, single episode, in full remission: Secondary | ICD-10-CM | POA: Diagnosis not present

## 2019-11-23 DIAGNOSIS — M545 Low back pain: Secondary | ICD-10-CM | POA: Diagnosis not present

## 2019-11-23 DIAGNOSIS — H409 Unspecified glaucoma: Secondary | ICD-10-CM | POA: Diagnosis not present

## 2019-11-23 DIAGNOSIS — I480 Paroxysmal atrial fibrillation: Secondary | ICD-10-CM | POA: Diagnosis not present

## 2019-11-27 DIAGNOSIS — F321 Major depressive disorder, single episode, moderate: Secondary | ICD-10-CM | POA: Diagnosis not present

## 2019-12-04 DIAGNOSIS — F321 Major depressive disorder, single episode, moderate: Secondary | ICD-10-CM | POA: Diagnosis not present

## 2019-12-09 DIAGNOSIS — U071 COVID-19: Secondary | ICD-10-CM | POA: Diagnosis not present

## 2019-12-18 DIAGNOSIS — F321 Major depressive disorder, single episode, moderate: Secondary | ICD-10-CM | POA: Diagnosis not present

## 2019-12-25 DIAGNOSIS — F321 Major depressive disorder, single episode, moderate: Secondary | ICD-10-CM | POA: Diagnosis not present

## 2019-12-26 ENCOUNTER — Other Ambulatory Visit: Payer: Self-pay | Admitting: Nurse Practitioner

## 2020-01-01 DIAGNOSIS — F321 Major depressive disorder, single episode, moderate: Secondary | ICD-10-CM | POA: Diagnosis not present

## 2020-01-04 ENCOUNTER — Telehealth: Payer: Self-pay

## 2020-01-05 ENCOUNTER — Ambulatory Visit: Payer: BC Managed Care – PPO | Attending: Internal Medicine

## 2020-01-05 DIAGNOSIS — Z23 Encounter for immunization: Secondary | ICD-10-CM | POA: Insufficient documentation

## 2020-01-05 NOTE — Progress Notes (Signed)
   Covid-19 Vaccination Clinic  Name:  Stacy Silva    MRN: [id number] DOB: [birth year]  01/05/2020  [name] was observed post Covid-19 immunization for 15 minutes without incidence. She was provided with Vaccine Information Sheet and instruction to access the V-Safe system.   [name] was instructed to call 911 with any severe reactions post vaccine: Marland Kitchen Difficulty breathing  . Swelling of your face and throat  . A fast heartbeat  . A bad rash all over your body  . Dizziness and weakness    Immunizations Administered    Name Date Dose VIS Date Route   Pfizer COVID-19 Vaccine 01/05/2020 11:22 AM 0.3 mL 11/24/2019 Intramuscular   Manufacturer: [place]   Lot: [id number]   [place]: [id number]

## 2020-01-08 ENCOUNTER — Telehealth (INDEPENDENT_AMBULATORY_CARE_PROVIDER_SITE_OTHER): Payer: BC Managed Care – PPO | Admitting: Internal Medicine

## 2020-01-08 ENCOUNTER — Telehealth: Payer: Self-pay

## 2020-01-08 ENCOUNTER — Encounter: Payer: Self-pay | Admitting: Internal Medicine

## 2020-01-08 VITALS — BP 108/69 | HR 63 | Ht 63.0 in | Wt 203.0 lb

## 2020-01-08 DIAGNOSIS — I480 Paroxysmal atrial fibrillation: Secondary | ICD-10-CM

## 2020-01-08 DIAGNOSIS — F321 Major depressive disorder, single episode, moderate: Secondary | ICD-10-CM | POA: Diagnosis not present

## 2020-01-08 MED ORDER — METOPROLOL SUCCINATE ER 50 MG PO TB24: 50.0000 mg | ORAL_TABLET | Freq: Every day | ORAL | 3 refills | Status: DC

## 2020-01-08 NOTE — Telephone Encounter (Signed)
Medication refilled as requested.  Recall entered.

## 2020-01-08 NOTE — Progress Notes (Signed)
Electrophysiology TeleHealth Note   Due to national recommendations of social distancing due to COVID 19, an audio/video telehealth visit is felt to be most appropriate for this patient at this time.  See MyChart message from today for the patient's consent to telehealth for [facility].   Date:  01/08/2020   ID:  Stacy Silva, Stacy Silva Sep 30, 1956, MRN [id number]  Location: patient's home  Provider location:  [facility]  Evaluation Performed: Follow-up visit  PCP:  Cari Caraway, MD   Electrophysiologist:  Dr Rayann Heman  Chief Complaint:  palpitations  History of Present Illness:    Stacy Silva is a 64 y.o. female who presents via telehealth conferencing today.  Since last being seen in our clinic, the patient reports doing very well.  Today, she denies symptoms of palpitations, chest pain, shortness of breath,  lower extremity edema, dizziness, presyncope, or syncope.  The patient is otherwise without complaint today.  The patient denies symptoms of fevers, chills, cough, or new SOB worrisome for COVID 19.  Past Medical History:  Diagnosis Date  . DDD (degenerative disc disease)   . Glaucoma   . Hyperlipidemia   . Insomnia   . Myalgia and myositis, unspecified   . Obesity   . Paroxysmal atrial fibrillation (HCC)   . Postmenopausal   . Sinus bradycardia     Past Surgical History:  Procedure Laterality Date  . APPENDECTOMY    . BUNIONECTOMY    . ELECTROPHYSIOLOGIC STUDY N/A 06/23/2016   PVI and CTI by Dr Rayann Heman  . LAMINECTOMY    . TONSILLECTOMY      Current Outpatient Medications  Medication Sig Dispense Refill  . diltiazem (CARDIZEM) 60 MG tablet Take 1 tablet (60 mg total) by mouth daily as needed (heart rate). 30 tablet 3  . docusate sodium (COLACE) 100 MG capsule Take 100 mg by mouth 2 (two) times daily.    Marland Kitchen ELIDEL 1 % cream Apply 1 application topically daily.    Marland Kitchen HYDROcodone-acetaminophen (NORCO/VICODIN) 5-325 MG per tablet Take 1-2 tablets by  mouth every 6 (six) hours as needed for moderate pain.   0  . hydroxychloroquine (PLAQUENIL) 200 MG tablet Take 200 mg by mouth 2 (two) times daily.  0  . latanoprost (XALATAN) 0.005 % ophthalmic solution Place 1 drop into both eyes as directed.    . metoprolol succinate (TOPROL-XL) 50 MG 24 hr tablet Take 1 tablet (50 mg total) by mouth daily. Please make overdue appt with Dr. Rayann Heman before anymore refills. 2nd attempt 15 tablet 0  . Misc Natural Products (TART CHERRY ADVANCED PO) Take 3 capsules by mouth daily.    . Multiple Vitamin (MULTIVITAMIN) capsule Take 1 capsule by mouth daily.    . NON FORMULARY Take 1 tablet by mouth daily. PROTANDIM antioxidant    . ondansetron (ZOFRAN) 8 MG tablet Take 8 mg by mouth as needed for nausea (1/2 hour before pain meds).    Marland Kitchen tiZANidine (ZANAFLEX) 4 MG tablet Take 4 mg by mouth as directed.    . traMADol (ULTRAM) 50 MG tablet Take 50 mg by mouth every 6 (six) hours as needed for moderate pain.   0   No current facility-administered medications for this visit.    Allergies:   Erythromycin, Pravastatin, Amoxil [amoxicillin], and Tape   Social History:  The patient  reports that she has quit smoking. She has never used smokeless tobacco. She reports that she does not drink alcohol or use drugs.   Family  History:  The patient's  family history includes Breast cancer in her maternal grandmother; CAD in her father; Congestive Heart Failure in her father; Heart attack in her father, maternal grandfather, and paternal grandfather.   ROS:  Please see the history of present illness.   All other systems are personally reviewed and negative.    Exam:    Vital Signs:  BP 108/69   Pulse 63   Ht 5\' 3"  (1.6 m)   Wt 203 lb (92.1 kg)   BMI 35.96 kg/m   Well sounding and appearing, alert and conversant, regular work of breathing,  good skin color Eyes- anicteric, neuro- grossly intact, skin- no apparent rash or lesions or cyanosis, mouth- oral mucosa is  pink  Labs/Other Tests and Data Reviewed:    Recent Labs: No results found for requested labs within last 8760 hours.   Wt Readings from Last 3 Encounters:  01/08/20 203 lb (92.1 kg)  06/15/18 194 lb 3.2 oz (88.1 kg)  05/03/17 181 lb 12.8 oz (82.5 kg)     ASSESSMENT & PLAN:    1.  Paroxysmal atrial fibrillation Well controlled post ablation off AAD therapy chads2vasc score is 1.  She does not require anticoagulation She is on toprol 50mg  daily.  I did offer that we could reduce the dose today.  She is clear that she does not wish to make changes at this time.  2. Overweight Lifestyle modification again discussed today   Follow-up:  12 months with EP APP   Patient Risk:  after full review of this patients clinical status, I feel that they are at moderate risk at this time.  Today, I have spent 15 minutes with the patient with telehealth technology discussing arrhythmia management .    Army Fossa, MD  01/08/2020 11:09 AM     [place] [address] [phone] (office) [phone] (fax)

## 2020-01-08 NOTE — Telephone Encounter (Signed)
-----   Message from Thompson Grayer, MD sent at 01/08/2020 11:14 AM EST ----- Follow-up with EP APP In a year  She needs her metoprolol refilled.  She is going out of town tomorrow and would like it today.  [facility] at [facility] [address]

## 2020-01-12 ENCOUNTER — Telehealth: Payer: Self-pay

## 2020-01-12 NOTE — Telephone Encounter (Signed)
Left message requesting call back reference PREP reference. Next class starting in Feb.  Will recall if no CB to offer again.

## 2020-01-15 DIAGNOSIS — F321 Major depressive disorder, single episode, moderate: Secondary | ICD-10-CM | POA: Diagnosis not present

## 2020-01-22 DIAGNOSIS — F321 Major depressive disorder, single episode, moderate: Secondary | ICD-10-CM | POA: Diagnosis not present

## 2020-01-26 ENCOUNTER — Ambulatory Visit: Payer: BC Managed Care – PPO | Attending: Internal Medicine

## 2020-01-26 DIAGNOSIS — Z23 Encounter for immunization: Secondary | ICD-10-CM | POA: Insufficient documentation

## 2020-01-26 NOTE — Progress Notes (Signed)
   Covid-19 Vaccination Clinic  Name:  Stacy Silva    MRN: [id number] DOB: [birth year]  01/26/2020  Stacy Silva was observed post Covid-19 immunization for 15 minutes without incidence. She was provided with Vaccine Information Sheet and instruction to access the V-Safe system.   Stacy Silva was instructed to call 911 with any severe reactions post vaccine: Marland Kitchen Difficulty breathing  . Swelling of your face and throat  . A fast heartbeat  . A bad rash all over your body  . Dizziness and weakness    Immunizations Administered    Name Date Dose VIS Date Route   Pfizer COVID-19 Vaccine 01/26/2020  4:20 PM 0.3 mL 11/24/2019 Intramuscular   Manufacturer: [place]   Lot: [id number]   [place]: [id number]

## 2020-01-29 DIAGNOSIS — F321 Major depressive disorder, single episode, moderate: Secondary | ICD-10-CM | POA: Diagnosis not present

## 2020-02-05 DIAGNOSIS — F321 Major depressive disorder, single episode, moderate: Secondary | ICD-10-CM | POA: Diagnosis not present

## 2020-02-12 DIAGNOSIS — M47816 Spondylosis without myelopathy or radiculopathy, lumbar region: Secondary | ICD-10-CM | POA: Diagnosis not present

## 2020-02-12 DIAGNOSIS — F321 Major depressive disorder, single episode, moderate: Secondary | ICD-10-CM | POA: Diagnosis not present

## 2020-02-19 DIAGNOSIS — F321 Major depressive disorder, single episode, moderate: Secondary | ICD-10-CM | POA: Diagnosis not present

## 2020-02-26 DIAGNOSIS — F321 Major depressive disorder, single episode, moderate: Secondary | ICD-10-CM | POA: Diagnosis not present

## 2020-03-04 DIAGNOSIS — F321 Major depressive disorder, single episode, moderate: Secondary | ICD-10-CM | POA: Diagnosis not present

## 2020-03-11 DIAGNOSIS — H401131 Primary open-angle glaucoma, bilateral, mild stage: Secondary | ICD-10-CM | POA: Diagnosis not present

## 2020-03-11 DIAGNOSIS — F321 Major depressive disorder, single episode, moderate: Secondary | ICD-10-CM | POA: Diagnosis not present

## 2020-03-11 DIAGNOSIS — H2513 Age-related nuclear cataract, bilateral: Secondary | ICD-10-CM | POA: Diagnosis not present

## 2020-03-18 DIAGNOSIS — F321 Major depressive disorder, single episode, moderate: Secondary | ICD-10-CM | POA: Diagnosis not present

## 2020-03-25 DIAGNOSIS — F321 Major depressive disorder, single episode, moderate: Secondary | ICD-10-CM | POA: Diagnosis not present

## 2020-04-01 DIAGNOSIS — F321 Major depressive disorder, single episode, moderate: Secondary | ICD-10-CM | POA: Diagnosis not present

## 2020-04-08 DIAGNOSIS — F321 Major depressive disorder, single episode, moderate: Secondary | ICD-10-CM | POA: Diagnosis not present

## 2020-04-15 DIAGNOSIS — F321 Major depressive disorder, single episode, moderate: Secondary | ICD-10-CM | POA: Diagnosis not present

## 2020-04-18 ENCOUNTER — Other Ambulatory Visit: Payer: Self-pay | Admitting: Family Medicine

## 2020-04-18 DIAGNOSIS — Z1231 Encounter for screening mammogram for malignant neoplasm of breast: Secondary | ICD-10-CM

## 2020-04-29 DIAGNOSIS — F321 Major depressive disorder, single episode, moderate: Secondary | ICD-10-CM | POA: Diagnosis not present

## 2020-05-06 DIAGNOSIS — F321 Major depressive disorder, single episode, moderate: Secondary | ICD-10-CM | POA: Diagnosis not present

## 2020-05-08 ENCOUNTER — Ambulatory Visit
Admission: RE | Admit: 2020-05-08 | Discharge: 2020-05-08 | Disposition: A | Discharge status: Home / Self Care | Payer: BC Managed Care – PPO | Source: Ambulatory Visit | Attending: Family Medicine | Admitting: Family Medicine

## 2020-05-08 ENCOUNTER — Other Ambulatory Visit: Payer: Self-pay

## 2020-05-08 DIAGNOSIS — Z1231 Encounter for screening mammogram for malignant neoplasm of breast: Secondary | ICD-10-CM | POA: Diagnosis not present

## 2020-05-13 DIAGNOSIS — F321 Major depressive disorder, single episode, moderate: Secondary | ICD-10-CM | POA: Diagnosis not present

## 2020-05-15 DIAGNOSIS — R21 Rash and other nonspecific skin eruption: Secondary | ICD-10-CM | POA: Diagnosis not present

## 2020-05-15 DIAGNOSIS — M2550 Pain in unspecified joint: Secondary | ICD-10-CM | POA: Diagnosis not present

## 2020-05-15 DIAGNOSIS — M359 Systemic involvement of connective tissue, unspecified: Secondary | ICD-10-CM | POA: Diagnosis not present

## 2020-05-15 DIAGNOSIS — M5136 Other intervertebral disc degeneration, lumbar region: Secondary | ICD-10-CM | POA: Diagnosis not present

## 2020-05-16 DIAGNOSIS — M47816 Spondylosis without myelopathy or radiculopathy, lumbar region: Secondary | ICD-10-CM | POA: Diagnosis not present

## 2020-05-20 DIAGNOSIS — F321 Major depressive disorder, single episode, moderate: Secondary | ICD-10-CM | POA: Diagnosis not present

## 2020-05-23 DIAGNOSIS — E782 Mixed hyperlipidemia: Secondary | ICD-10-CM | POA: Diagnosis not present

## 2020-05-23 DIAGNOSIS — F325 Major depressive disorder, single episode, in full remission: Secondary | ICD-10-CM | POA: Diagnosis not present

## 2020-05-23 DIAGNOSIS — M359 Systemic involvement of connective tissue, unspecified: Secondary | ICD-10-CM | POA: Diagnosis not present

## 2020-05-27 DIAGNOSIS — F321 Major depressive disorder, single episode, moderate: Secondary | ICD-10-CM | POA: Diagnosis not present

## 2020-06-03 DIAGNOSIS — F321 Major depressive disorder, single episode, moderate: Secondary | ICD-10-CM | POA: Diagnosis not present

## 2020-06-17 DIAGNOSIS — F321 Major depressive disorder, single episode, moderate: Secondary | ICD-10-CM | POA: Diagnosis not present

## 2020-06-24 DIAGNOSIS — F321 Major depressive disorder, single episode, moderate: Secondary | ICD-10-CM | POA: Diagnosis not present

## 2020-07-01 DIAGNOSIS — F321 Major depressive disorder, single episode, moderate: Secondary | ICD-10-CM | POA: Diagnosis not present

## 2020-07-04 DIAGNOSIS — M1711 Unilateral primary osteoarthritis, right knee: Secondary | ICD-10-CM | POA: Diagnosis not present

## 2020-07-04 DIAGNOSIS — M25561 Pain in right knee: Secondary | ICD-10-CM | POA: Diagnosis not present

## 2020-07-08 DIAGNOSIS — F321 Major depressive disorder, single episode, moderate: Secondary | ICD-10-CM | POA: Diagnosis not present

## 2020-07-15 DIAGNOSIS — F321 Major depressive disorder, single episode, moderate: Secondary | ICD-10-CM | POA: Diagnosis not present

## 2020-07-22 DIAGNOSIS — F321 Major depressive disorder, single episode, moderate: Secondary | ICD-10-CM | POA: Diagnosis not present

## 2020-07-23 DIAGNOSIS — H401131 Primary open-angle glaucoma, bilateral, mild stage: Secondary | ICD-10-CM | POA: Diagnosis not present

## 2020-07-29 DIAGNOSIS — F321 Major depressive disorder, single episode, moderate: Secondary | ICD-10-CM | POA: Diagnosis not present

## 2020-08-12 DIAGNOSIS — F321 Major depressive disorder, single episode, moderate: Secondary | ICD-10-CM | POA: Diagnosis not present

## 2021-01-31 ENCOUNTER — Other Ambulatory Visit: Payer: Self-pay | Admitting: Internal Medicine

## 2021-02-17 NOTE — Progress Notes (Signed)
Cardiology Office Note Date:  02/17/2021  Patient ID:  Stacy, Silva Mar 31, 1956, MRN [id number] PCP:  Cari Caraway, MD  Electrophysiologist: Dr. Rayann Heman Rheumatology: Dr. Amil Amen   Chief Complaint: annual visit  History of Present Illness: Stacy Silva is a 65 y.o. 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 Rayann Heman, last seen by him via tele health visit, she was doing well, did not want to stop her BB.  She was not on a/c with score of one (female)  TODAY She is doing really well. Has never had to use her PRN dilt since her ablation. Feels best keeping the metoprolol on board. No dizziness, weakness, near syncope or syncope. No CP, palpitations or cardiac awareness. No SOB or DOE  She is actively working on losing weight and has lost a few pounds, does not exercise, used to participate in water aerobics, but since [place] stopped the gym.  Encouraged to find exercising that her back/kneess can tolerate.  Labs are done with her PMD, last lipid check reported to be good    AFib Hx Diagnosed 2010 PVI/CTI ablation 06/23/2016  AAD  Flecainide 2014 > d/c AUg 2017 post ablation   Past Medical History:  Diagnosis Date  . DDD (degenerative disc disease)   . Glaucoma   . Hyperlipidemia   . Insomnia   . Myalgia and myositis, unspecified   . Obesity   . Paroxysmal atrial fibrillation (HCC)   . Postmenopausal   . Sinus bradycardia     Past Surgical History:  Procedure Laterality Date  . APPENDECTOMY    . BUNIONECTOMY    . ELECTROPHYSIOLOGIC STUDY N/A 06/23/2016   PVI and CTI by Dr Rayann Heman  . LAMINECTOMY    . TONSILLECTOMY      Current Outpatient Medications  Medication Sig Dispense Refill  . diltiazem (CARDIZEM) 60 MG tablet Take 1 tablet (60 mg total) by mouth daily as needed (heart rate). 30 tablet 3   . docusate sodium (COLACE) 100 MG capsule Take 100 mg by mouth 2 (two) times daily.    Marland Kitchen ELIDEL 1 % cream Apply 1 application topically daily.    Marland Kitchen HYDROcodone-acetaminophen (NORCO/VICODIN) 5-325 MG per tablet Take 1-2 tablets by mouth every 6 (six) hours as needed for moderate pain.   0  . hydroxychloroquine (PLAQUENIL) 200 MG tablet Take 200 mg by mouth 2 (two) times daily.  0  . latanoprost (XALATAN) 0.005 % ophthalmic solution Place 1 drop into both eyes as directed.    . metoprolol succinate (TOPROL-XL) 50 MG 24 hr tablet TAKE ONE TABLET BY MOUTH DAILY 90 tablet 3  . Misc Natural Products (TART CHERRY ADVANCED PO) Take 3 capsules by mouth daily.    . Multiple Vitamin (MULTIVITAMIN) capsule Take 1 capsule by mouth daily.    . NON FORMULARY Take 1 tablet by mouth daily. PROTANDIM antioxidant    . ondansetron (ZOFRAN) 8 MG tablet Take 8 mg by mouth as needed for nausea (1/2 hour before pain meds).    Marland Kitchen tiZANidine (ZANAFLEX) 4 MG tablet Take 4 mg by mouth as directed.    . traMADol (ULTRAM) 50 MG tablet Take 50 mg by mouth every 6 (six) hours as needed for moderate pain.   0   No current facility-administered medications for this visit.    Allergies:   Erythromycin, Pravastatin, Amoxil [amoxicillin], and  Tape   Social History:  The patient  reports that she has quit smoking. She has never used smokeless tobacco. She reports that she does not drink alcohol and does not use drugs.   Family History:  The patient's family history includes Breast cancer in her maternal grandmother; CAD in her father; Congestive Heart Failure in her father; Heart attack in her father, maternal grandfather, and paternal grandfather.  ROS:  Please see the history of present illness.    All other systems are reviewed and otherwise negative.   PHYSICAL EXAM:  VS:  There were no vitals taken for this visit. BMI: There is no height or weight on file to calculate BMI. Well nourished, well developed, in no acute  distress HEENT: normocephalic, atraumatic Neck: no JVD, carotid bruits or masses Cardiac:  RRR; no significant murmurs, no rubs, or gallops Lungs:  CTA b/l, no wheezing, rhonchi or rales Abd: soft, nontender MS: no deformity or atrophy Ext: no edema Skin: warm and dry, no rash Neuro:  No gross deficits appreciated Psych: euthymic mood, full affect     EKG:  Done today and reviewed by myself shows  SB 59bpm  06/23/2016; EPS/ablation CONCLUSIONS: 1. Sinus rhythm upon presentation.   2. Intracardiac echo reveals a moderate sized left atrium with four separate pulmonary veins without evidence of pulmonary vein stenosis. 3. Successful electrical isolation and anatomical encircling of all four pulmonary veins with radiofrequency current.    4. Isthmus dependant right atrial flutter induced with dobutamine and successfully ablated along the cavotricuspid isthmus. 5. No early apparent complications.   05/06/2016: TTE Study Conclusions  - Left ventricle: The cavity size was normal. Posterior wall  thickness was increased in a pattern of mild LVH. Systolic  function was normal. The estimated ejection fraction was in the  range of 60% to 65%. Wall motion was normal; there were no  regional wall motion abnormalities. Features are consistent with  a pseudonormal left ventricular filling pattern, with concomitant  abnormal relaxation and indeterminate filling pressure (grade 2  diastolic dysfunction).  - Aortic valve: Transvalvular velocity was within the normal range.  There was no stenosis. There was no regurgitation.  - Mitral valve: There was trivial regurgitation.  - Left atrium: The atrium was mildly dilated.  - Right ventricle: The cavity size was normal. Wall thickness was  normal. Systolic function was normal.  - Atrial septum: No defect or patent foramen ovale was identified  by color flow Doppler.  - Tricuspid valve: There was no regurgitation.      Recent Labs: No results found for requested labs within last 8760 hours.  No results found for requested labs within last 8760 hours.   CrCl cannot be calculated (Patient's most recent lab result is older than the maximum 21 days allowed.).   Wt Readings from Last 3 Encounters:  01/08/20 203 lb (92.1 kg)  06/15/18 194 lb 3.2 oz (88.1 kg)  05/03/17 181 lb 12.8 oz (82.5 kg)     Other studies reviewed: Additional studies/records reviewed today include: summarized above  ASSESSMENT AND PLAN:  1. Paroxysmal Afib     CHA2DS2vasc is one for female only, not on a/c     No symptoms to suggest recurrent arrhythmia  2. Sinus bradycardia     Asymtomatic, is most comfortable staying on her BB  Disposition: F/u with   Current medicines are reviewed at length with the patient today.  The patient did not have any concerns regarding medicines.  Signed, Tommye Standard,  PA-C 02/17/2021 5:23 PM     [place] [address] [phone] (office)  [phone] (fax)

## 2021-02-19 ENCOUNTER — Encounter: Payer: Self-pay | Admitting: Physician Assistant

## 2021-02-19 ENCOUNTER — Ambulatory Visit (INDEPENDENT_AMBULATORY_CARE_PROVIDER_SITE_OTHER): Payer: Medicare Other | Admitting: Physician Assistant

## 2021-02-19 ENCOUNTER — Other Ambulatory Visit: Payer: Self-pay

## 2021-02-19 VITALS — BP 128/86 | HR 59 | Ht 68.0 in | Wt 195.0 lb

## 2021-02-19 DIAGNOSIS — I480 Paroxysmal atrial fibrillation: Secondary | ICD-10-CM | POA: Diagnosis not present

## 2021-02-19 DIAGNOSIS — R001 Bradycardia, unspecified: Secondary | ICD-10-CM

## 2021-02-19 NOTE — Patient Instructions (Signed)
Medication Instructions:   Your physician recommends that you continue on your current medications as directed. Please refer to the Current Medication list given to you today.  *If you need a refill on your cardiac medications before your next appointment, please call your pharmacy*   Lab Work: NONE ORDERED  TODAY   If you have labs (blood work) drawn today and your tests are completely normal, you will receive your results only by: . MyChart Message (if you have MyChart) OR . A paper copy in the mail If you have any lab test that is abnormal or we need to change your treatment, we will call you to review the results.   Testing/Procedures: NONE ORDERED  TODAY    Follow-Up: At [facility], you and your health needs are our priority.  As part of our continuing mission to provide you with exceptional heart care, we have created designated Provider Care Teams.  These Care Teams include your primary Cardiologist (physician) and Advanced Practice Providers (APPs -  Physician Assistants and Nurse Practitioners) who all work together to provide you with the care you need, when you need it.  We recommend signing up for the patient portal called "MyChart".  Sign up information is provided on this After Visit Summary.  MyChart is used to connect with patients for Virtual Visits (Telemedicine).  Patients are able to view lab/test results, encounter notes, upcoming appointments, etc.  Non-urgent messages can be sent to your provider as well.   To learn more about what you can do with MyChart, go to [email or web address].    Your next appointment:   1 year(s)  The format for your next appointment:   In Person  Provider:   Renee Ursuy, PA-C   Other Instructions   

## 2021-06-25 ENCOUNTER — Other Ambulatory Visit: Payer: Self-pay | Admitting: Family Medicine

## 2021-06-25 DIAGNOSIS — Z1231 Encounter for screening mammogram for malignant neoplasm of breast: Secondary | ICD-10-CM

## 2021-06-30 ENCOUNTER — Other Ambulatory Visit: Payer: Self-pay

## 2021-06-30 ENCOUNTER — Ambulatory Visit
Admission: RE | Admit: 2021-06-30 | Discharge: 2021-06-30 | Disposition: A | Discharge status: Home / Self Care | Payer: Medicare Other | Source: Ambulatory Visit | Attending: Family Medicine | Admitting: Family Medicine

## 2021-06-30 DIAGNOSIS — Z1231 Encounter for screening mammogram for malignant neoplasm of breast: Secondary | ICD-10-CM

## 2021-12-02 ENCOUNTER — Other Ambulatory Visit: Payer: Self-pay | Admitting: Family Medicine

## 2021-12-02 DIAGNOSIS — Z1231 Encounter for screening mammogram for malignant neoplasm of breast: Secondary | ICD-10-CM

## 2021-12-02 DIAGNOSIS — E2839 Other primary ovarian failure: Secondary | ICD-10-CM

## 2021-12-18 DIAGNOSIS — L409 Psoriasis, unspecified: Secondary | ICD-10-CM | POA: Diagnosis not present

## 2021-12-18 DIAGNOSIS — M722 Plantar fascial fibromatosis: Secondary | ICD-10-CM | POA: Diagnosis not present

## 2021-12-18 DIAGNOSIS — M150 Primary generalized (osteo)arthritis: Secondary | ICD-10-CM | POA: Diagnosis not present

## 2021-12-18 DIAGNOSIS — M359 Systemic involvement of connective tissue, unspecified: Secondary | ICD-10-CM | POA: Diagnosis not present

## 2021-12-18 DIAGNOSIS — M5136 Other intervertebral disc degeneration, lumbar region: Secondary | ICD-10-CM | POA: Diagnosis not present

## 2022-02-03 DIAGNOSIS — M47816 Spondylosis without myelopathy or radiculopathy, lumbar region: Secondary | ICD-10-CM | POA: Diagnosis not present

## 2022-02-04 ENCOUNTER — Other Ambulatory Visit: Payer: Self-pay | Admitting: Neurosurgery

## 2022-02-04 DIAGNOSIS — M47816 Spondylosis without myelopathy or radiculopathy, lumbar region: Secondary | ICD-10-CM

## 2022-02-18 ENCOUNTER — Other Ambulatory Visit: Payer: Self-pay

## 2022-02-18 ENCOUNTER — Ambulatory Visit
Admission: RE | Admit: 2022-02-18 | Discharge: 2022-02-18 | Disposition: A | Discharge status: Home / Self Care | Payer: Medicare Other | Source: Ambulatory Visit | Attending: Neurosurgery | Admitting: Neurosurgery

## 2022-02-18 DIAGNOSIS — M47816 Spondylosis without myelopathy or radiculopathy, lumbar region: Secondary | ICD-10-CM

## 2022-02-18 DIAGNOSIS — M4316 Spondylolisthesis, lumbar region: Secondary | ICD-10-CM | POA: Diagnosis not present

## 2022-02-18 DIAGNOSIS — M4126 Other idiopathic scoliosis, lumbar region: Secondary | ICD-10-CM | POA: Diagnosis not present

## 2022-02-18 DIAGNOSIS — M48061 Spinal stenosis, lumbar region without neurogenic claudication: Secondary | ICD-10-CM | POA: Diagnosis not present

## 2022-03-04 DIAGNOSIS — M25561 Pain in right knee: Secondary | ICD-10-CM | POA: Diagnosis not present

## 2022-03-04 DIAGNOSIS — M47816 Spondylosis without myelopathy or radiculopathy, lumbar region: Secondary | ICD-10-CM | POA: Diagnosis not present

## 2022-03-05 DIAGNOSIS — L538 Other specified erythematous conditions: Secondary | ICD-10-CM | POA: Diagnosis not present

## 2022-03-05 DIAGNOSIS — L820 Inflamed seborrheic keratosis: Secondary | ICD-10-CM | POA: Diagnosis not present

## 2022-03-05 DIAGNOSIS — Z789 Other specified health status: Secondary | ICD-10-CM | POA: Diagnosis not present

## 2022-03-05 DIAGNOSIS — D692 Other nonthrombocytopenic purpura: Secondary | ICD-10-CM | POA: Diagnosis not present

## 2022-03-30 ENCOUNTER — Telehealth: Payer: Self-pay | Admitting: Physician Assistant

## 2022-03-30 ENCOUNTER — Other Ambulatory Visit: Payer: Self-pay | Admitting: Internal Medicine

## 2022-03-30 MED ORDER — METOPROLOL SUCCINATE ER 50 MG PO TB24: 50.0000 mg | ORAL_TABLET | Freq: Every day | ORAL | 0 refills | Status: DC

## 2022-03-30 NOTE — Telephone Encounter (Signed)
Pt scheduled to see Andi Hence, PA-C, 04/21/22, #90 Metoprolol has been sent to [facility]. ?

## 2022-03-30 NOTE — Telephone Encounter (Signed)
?*  STAT* If patient is at the pharmacy, call can be transferred to refill team. ? ? ?1. Which medications need to be refilled? (please list name of each medication and dose if known) metoprolol succinate (TOPROL-XL) 50 MG 24 hr tablet ? ?2. Which pharmacy/location (including street and city if local pharmacy) is medication to be sent to? [facility] [id number] - [place], [place] ? ?3. Do they need a 30 day or 90 day supply? 90  ?

## 2022-04-15 NOTE — Progress Notes (Signed)
? ?Cardiology Office Note ?Date:  04/17/2022  ?Patient ID:  Stacy Silva, Stacy Silva 08-Sep-1956, MRN [id number] ?PCP:  Cari Caraway, MD  ?Electrophysiologist: Dr. Rayann Heman ?Rheumatology: Dr. Amil Amen ?  ?Chief Complaint:  annual visit ? ?History of Present Illness: ?[place] is a 66 y.o. female with history of HLD, AFib, sinus bradycardia, chronic back pain, she follows with rheumatology 2/2 nonspecific connective issue disorder, "latent lupus" ? ?She comes in today to be seen for Dr. Rayann Heman, last seen by him via tele health visit, she was doing well, did not want to stop her BB.  She was not on a/c with score of one (female) ? ?I saw her March 2022 ?She is doing really well. ?Has never had to use her PRN dilt since her ablation. ?Feels best keeping the metoprolol on board. ?No dizziness, weakness, near syncope or syncope. ?No CP, palpitations or cardiac awareness. ?No SOB or DOE ?She is actively working on losing weight and has lost a few pounds, does not exercise, used to participate in water aerobics, but since [place] stopped the gym.  Encouraged to find exercising that her back/kneess can tolerate. ?Labs are done with her PMD, last lipid check reported to be good ?No changes made. ? ?TODAY ?She continues to do very well ?She started a new OTC supplement with great improvement in her back/generalized pain. ?Following regularly with her PMD, gets her vaccinations/labs are all up to date ?Walking on the treadmill 71mn day ?No CP, palpitations or cardiac awareness ?She does not think she has had any AFib ?No dizziness, near syncope or syncope. ?No SOB, DOE ? ?  ?AFib Hx ?Diagnosed 2010 ?PVI/CTI ablation 06/23/2016 ? ?AAD  ?Flecainide 2014 > d/c Aug 2017 post ablation ? ? ?Past Medical History:  ?Diagnosis Date  ? DDD (degenerative disc disease)   ? Glaucoma   ? Hyperlipidemia   ? Insomnia   ? Myalgia and myositis, unspecified   ? Obesity   ? Paroxysmal atrial fibrillation (HCC)   ? Postmenopausal   ? Sinus  bradycardia   ? ? ?Past Surgical History:  ?Procedure Laterality Date  ? APPENDECTOMY    ? BUNIONECTOMY    ? ELECTROPHYSIOLOGIC STUDY N/A 06/23/2016  ? PVI and CTI by Dr ARayann Heman ? LAMINECTOMY    ? TONSILLECTOMY    ? ? ?Current Outpatient Medications  ?Medication Sig Dispense Refill  ? aspirin 81 MG EC tablet Take 1 tablet by mouth daily.    ? diltiazem (CARDIZEM) 60 MG tablet Take 1 tablet (60 mg total) by mouth daily as needed (heart rate). 30 tablet 3  ? DULoxetine (CYMBALTA) 60 MG capsule Take 60 mg by mouth daily.    ? ELIDEL 1 % cream Apply 1 application topically daily.    ? HYDROcodone-acetaminophen (NORCO/VICODIN) 5-325 MG per tablet Take 1-2 tablets by mouth every 6 (six) hours as needed for moderate pain.   0  ? hydroxychloroquine (PLAQUENIL) 200 MG tablet Take 200 mg by mouth 2 (two) times daily.  0  ? latanoprost (XALATAN) 0.005 % ophthalmic solution Place 1 drop into both eyes as directed.    ? metoprolol succinate (TOPROL-XL) 50 MG 24 hr tablet Take 1 tablet (50 mg total) by mouth daily. Take with or immediately following a meal. 90 tablet 0  ? Misc Natural Products (TART CHERRY ADVANCED PO) Take 3 capsules by mouth daily.    ? Multiple Vitamin (MULTIVITAMIN) capsule Take 1 capsule by mouth daily.    ? NON FORMULARY Take 1  tablet by mouth daily. PROTANDIM antioxidant    ? tiZANidine (ZANAFLEX) 4 MG tablet Take 4 mg by mouth as directed.    ? traMADol (ULTRAM) 50 MG tablet Take 50 mg by mouth every 6 (six) hours as needed for moderate pain.   0  ? docusate sodium (COLACE) 100 MG capsule Take 100 mg by mouth 2 (two) times daily. (Patient not taking: Reported on 04/17/2022)    ? ondansetron (ZOFRAN) 8 MG tablet Take 8 mg by mouth as needed for nausea (1/2 hour before pain meds). (Patient not taking: Reported on 04/17/2022)    ? ?No current facility-administered medications for this visit.  ? ? ?Allergies:   Erythromycin, Pravastatin, Amoxil [amoxicillin], and Tape  ? ?Social History:  The patient  reports that  she has quit smoking. She has never used smokeless tobacco. She reports that she does not drink alcohol and does not use drugs.  ? ?Family History:  The patient's family history includes Breast cancer in her maternal grandmother; CAD in her father; Congestive Heart Failure in her father; Heart attack in her father, maternal grandfather, and paternal grandfather. ? ?ROS:  Please see the history of present illness.    ?All other systems are reviewed and otherwise negative.  ? ?PHYSICAL EXAM:  ?VS:  BP 126/82   Pulse 66   Ht '5\' 8"'$  (1.727 m)   Wt 201 lb 3.2 oz (91.3 kg)   SpO2 99%   BMI 30.59 kg/m?  BMI: Body mass index is 30.59 kg/m?. ?Well nourished, well developed, in no acute distress ?HEENT: normocephalic, atraumatic ?Neck: no JVD, carotid bruits or masses ?Cardiac:  RRR; no significant murmurs, no rubs, or gallops ?Lungs: CTA b/l, no wheezing, rhonchi or rales ?Abd: soft, nontender ?MS: no deformity or atrophy ?Ext: no edema ?Skin: warm and dry, no rash ?Neuro:  No gross deficits appreciated ?Psych: euthymic mood, full affect ? ? ? ? ?EKG:  Done today and reviewed by myself shows  ?SR 66bpm, normal ? ?06/23/2016; EPS/ablation ?CONCLUSIONS: ?1. Sinus rhythm upon presentation.   ?2. Intracardiac echo reveals a moderate sized left atrium with four separate pulmonary veins without evidence of pulmonary vein stenosis. ?3. Successful electrical isolation and anatomical encircling of all four pulmonary veins with radiofrequency current.    ?4. Isthmus dependant right atrial flutter induced with dobutamine and successfully ablated along the cavotricuspid isthmus. ?5. No early apparent complications. ? ? ?05/06/2016: TTE ?Study Conclusions  ?- Left ventricle: The cavity size was normal. Posterior wall  ?  thickness was increased in a pattern of mild LVH. Systolic  ?  function was normal. The estimated ejection fraction was in the  ?  range of 60% to 65%. Wall motion was normal; there were no  ?  regional wall motion  abnormalities. Features are consistent with  ?  a pseudonormal left ventricular filling pattern, with concomitant  ?  abnormal relaxation and indeterminate filling pressure (grade 2  ?  diastolic dysfunction).  ?- Aortic valve: Transvalvular velocity was within the normal range.  ?  There was no stenosis. There was no regurgitation.  ?- Mitral valve: There was trivial regurgitation.  ?- Left atrium: The atrium was mildly dilated.  ?- Right ventricle: The cavity size was normal. Wall thickness was  ?  normal. Systolic function was normal.  ?- Atrial septum: No defect or patent foramen ovale was identified  ?  by color flow Doppler.  ?- Tricuspid valve: There was no regurgitation.  ? ? ? ?Recent Labs: ?  No results found for requested labs within last 8760 hours.  ?No results found for requested labs within last 8760 hours.  ? ?CrCl cannot be calculated (Patient's most recent lab result is older than the maximum 21 days allowed.).  ? ?Wt Readings from Last 3 Encounters:  ?04/17/22 201 lb 3.2 oz (91.3 kg)  ?02/19/21 195 lb (88.5 kg)  ?01/08/20 203 lb (92.1 kg)  ?  ? ?Other studies reviewed: ?Additional studies/records reviewed today include: summarized above ? ?ASSESSMENT AND PLAN: ? ?1. Paroxysmal Afib ?    CHA2DS2vasc is 2 gender/age, not on a/c ?    No symptoms to suggest recurrent arrhythmia ? ? ?Disposition: we will continue to see her annually, she is aware of Dr. Jackalyn Lombard slow down, comfortable stayongh on board with annual visits and establishing with another EP MD eventually  ? ?Current medicines are reviewed at length with the patient today.  The patient did not have any concerns regarding medicines. ? ?Signed, ?Tommye Standard, PA-C ?04/17/2022 8:13 AM    ? ?[facility] ?[address] ?[address] ?[place] [address] ?[phone] (office)  ?[phone] (fax) ? ? ?

## 2022-04-17 ENCOUNTER — Ambulatory Visit: Payer: Medicare Other | Admitting: Physician Assistant

## 2022-04-17 ENCOUNTER — Encounter: Payer: Self-pay | Admitting: Physician Assistant

## 2022-04-17 ENCOUNTER — Other Ambulatory Visit: Payer: Self-pay | Admitting: *Deleted

## 2022-04-17 VITALS — BP 126/82 | HR 66 | Ht 68.0 in | Wt 201.2 lb

## 2022-04-17 DIAGNOSIS — I480 Paroxysmal atrial fibrillation: Secondary | ICD-10-CM

## 2022-04-17 MED ORDER — METOPROLOL SUCCINATE ER 50 MG PO TB24: 50.0000 mg | ORAL_TABLET | Freq: Every day | ORAL | 3 refills | Status: DC

## 2022-04-17 NOTE — Patient Instructions (Signed)
Medication Instructions:  ? ?Your physician recommends that you continue on your current medications as directed. Please refer to the Current Medication list given to you today. ? ? ?*If you need a refill on your cardiac medications before your next appointment, please call your pharmacy* ? ? ?Lab Work: [place] ? ? ?If you have labs (blood work) drawn today and your tests are completely normal, you will receive your results only by: ?MyChart Message (if you have MyChart) OR ?A paper copy in the mail ?If you have any lab test that is abnormal or we need to change your treatment, we will call you to review the results. ? ? ?Testing/Procedures: NONE ORDERED  TODAY ? ? ? ?Follow-Up: ?At [facility], you and your health needs are our priority.  As part of our continuing mission to provide you with exceptional heart care, we have created designated Provider Care Teams.  These Care Teams include your primary Cardiologist (physician) and Advanced Practice Providers (APPs -  Physician Assistants and Nurse Practitioners) who all work together to provide you with the care you need, when you need it. ? ?We recommend signing up for the patient portal called "MyChart".  Sign up information is provided on this After Visit Summary.  MyChart is used to connect with patients for Virtual Visits (Telemedicine).  Patients are able to view lab/test results, encounter notes, upcoming appointments, etc.  Non-urgent messages can be sent to your provider as well.   ?To learn more about what you can do with MyChart, go to [email or web address].   ? ?Your next appointment:   ?1 year(s) ? ?The format for your next appointment:   ?In Person ? ?Provider:   ?You may see Dr.Allred  or one of the following Advanced Practice Providers on your designated Care Team:   ?Tommye Standard, PA-C ? ? ? :1 ? ?Other Instructions ? ? ?Important Information About Sugar ? ? ? ? ?  ?

## 2022-04-20 NOTE — Addendum Note (Signed)
Addended by: Michelle Nasuti on: 04/20/2022 08:07 AM ? ? Modules accepted: Orders ? ?

## 2022-05-21 ENCOUNTER — Ambulatory Visit
Admission: RE | Admit: 2022-05-21 | Discharge: 2022-05-21 | Disposition: A | Discharge status: Home / Self Care | Payer: Medicare Other | Source: Ambulatory Visit | Attending: Family Medicine | Admitting: Family Medicine

## 2022-05-21 DIAGNOSIS — Z780 Asymptomatic menopausal state: Secondary | ICD-10-CM | POA: Diagnosis not present

## 2022-05-21 DIAGNOSIS — E2839 Other primary ovarian failure: Secondary | ICD-10-CM

## 2022-05-21 DIAGNOSIS — M47816 Spondylosis without myelopathy or radiculopathy, lumbar region: Secondary | ICD-10-CM | POA: Diagnosis not present

## 2022-05-26 DIAGNOSIS — R7309 Other abnormal glucose: Secondary | ICD-10-CM | POA: Diagnosis not present

## 2022-05-26 DIAGNOSIS — R7301 Impaired fasting glucose: Secondary | ICD-10-CM | POA: Diagnosis not present

## 2022-05-29 DIAGNOSIS — R7301 Impaired fasting glucose: Secondary | ICD-10-CM | POA: Diagnosis not present

## 2022-06-09 DIAGNOSIS — H401131 Primary open-angle glaucoma, bilateral, mild stage: Secondary | ICD-10-CM | POA: Diagnosis not present

## 2022-06-18 DIAGNOSIS — L409 Psoriasis, unspecified: Secondary | ICD-10-CM | POA: Diagnosis not present

## 2022-06-18 DIAGNOSIS — M1991 Primary osteoarthritis, unspecified site: Secondary | ICD-10-CM | POA: Diagnosis not present

## 2022-06-18 DIAGNOSIS — M359 Systemic involvement of connective tissue, unspecified: Secondary | ICD-10-CM | POA: Diagnosis not present

## 2022-06-18 DIAGNOSIS — M722 Plantar fascial fibromatosis: Secondary | ICD-10-CM | POA: Diagnosis not present

## 2022-06-18 DIAGNOSIS — M5136 Other intervertebral disc degeneration, lumbar region: Secondary | ICD-10-CM | POA: Diagnosis not present

## 2022-06-27 ENCOUNTER — Other Ambulatory Visit: Payer: Self-pay | Admitting: Internal Medicine

## 2022-07-21 DIAGNOSIS — D692 Other nonthrombocytopenic purpura: Secondary | ICD-10-CM | POA: Diagnosis not present

## 2022-07-21 DIAGNOSIS — L309 Dermatitis, unspecified: Secondary | ICD-10-CM | POA: Diagnosis not present

## 2022-07-30 DIAGNOSIS — K644 Residual hemorrhoidal skin tags: Secondary | ICD-10-CM | POA: Diagnosis not present

## 2022-07-30 DIAGNOSIS — Z1211 Encounter for screening for malignant neoplasm of colon: Secondary | ICD-10-CM | POA: Diagnosis not present

## 2022-07-30 DIAGNOSIS — D124 Benign neoplasm of descending colon: Secondary | ICD-10-CM | POA: Diagnosis not present

## 2022-07-30 DIAGNOSIS — K648 Other hemorrhoids: Secondary | ICD-10-CM | POA: Diagnosis not present

## 2022-08-03 DIAGNOSIS — D124 Benign neoplasm of descending colon: Secondary | ICD-10-CM | POA: Diagnosis not present

## 2022-08-19 DIAGNOSIS — M47816 Spondylosis without myelopathy or radiculopathy, lumbar region: Secondary | ICD-10-CM | POA: Diagnosis not present

## 2022-09-16 DIAGNOSIS — R7301 Impaired fasting glucose: Secondary | ICD-10-CM | POA: Diagnosis not present

## 2022-09-16 DIAGNOSIS — E8889 Other specified metabolic disorders: Secondary | ICD-10-CM | POA: Diagnosis not present

## 2022-09-16 DIAGNOSIS — E782 Mixed hyperlipidemia: Secondary | ICD-10-CM | POA: Diagnosis not present

## 2022-09-30 DIAGNOSIS — E782 Mixed hyperlipidemia: Secondary | ICD-10-CM | POA: Diagnosis not present

## 2022-09-30 DIAGNOSIS — R7301 Impaired fasting glucose: Secondary | ICD-10-CM | POA: Diagnosis not present

## 2022-10-01 DIAGNOSIS — N3000 Acute cystitis without hematuria: Secondary | ICD-10-CM | POA: Diagnosis not present

## 2022-10-05 DIAGNOSIS — N390 Urinary tract infection, site not specified: Secondary | ICD-10-CM | POA: Diagnosis not present

## 2022-10-05 DIAGNOSIS — R300 Dysuria: Secondary | ICD-10-CM | POA: Diagnosis not present

## 2022-10-09 DIAGNOSIS — R300 Dysuria: Secondary | ICD-10-CM | POA: Diagnosis not present

## 2022-10-12 DIAGNOSIS — H5201 Hypermetropia, right eye: Secondary | ICD-10-CM | POA: Diagnosis not present

## 2022-10-12 DIAGNOSIS — R7303 Prediabetes: Secondary | ICD-10-CM | POA: Diagnosis not present

## 2022-10-12 DIAGNOSIS — H401131 Primary open-angle glaucoma, bilateral, mild stage: Secondary | ICD-10-CM | POA: Diagnosis not present

## 2022-10-12 DIAGNOSIS — H524 Presbyopia: Secondary | ICD-10-CM | POA: Diagnosis not present

## 2022-10-12 DIAGNOSIS — H2513 Age-related nuclear cataract, bilateral: Secondary | ICD-10-CM | POA: Diagnosis not present

## 2022-10-12 DIAGNOSIS — H52203 Unspecified astigmatism, bilateral: Secondary | ICD-10-CM | POA: Diagnosis not present

## 2022-10-14 DIAGNOSIS — E782 Mixed hyperlipidemia: Secondary | ICD-10-CM | POA: Diagnosis not present

## 2022-10-14 DIAGNOSIS — K5903 Drug induced constipation: Secondary | ICD-10-CM | POA: Diagnosis not present

## 2022-10-14 DIAGNOSIS — R7301 Impaired fasting glucose: Secondary | ICD-10-CM | POA: Diagnosis not present

## 2022-11-10 DIAGNOSIS — M47816 Spondylosis without myelopathy or radiculopathy, lumbar region: Secondary | ICD-10-CM | POA: Diagnosis not present

## 2022-11-10 DIAGNOSIS — K5903 Drug induced constipation: Secondary | ICD-10-CM | POA: Diagnosis not present

## 2022-11-10 DIAGNOSIS — R7301 Impaired fasting glucose: Secondary | ICD-10-CM | POA: Diagnosis not present

## 2022-11-10 DIAGNOSIS — M25561 Pain in right knee: Secondary | ICD-10-CM | POA: Diagnosis not present

## 2022-11-10 DIAGNOSIS — E782 Mixed hyperlipidemia: Secondary | ICD-10-CM | POA: Diagnosis not present

## 2022-12-21 DIAGNOSIS — M1991 Primary osteoarthritis, unspecified site: Secondary | ICD-10-CM | POA: Diagnosis not present

## 2022-12-21 DIAGNOSIS — M722 Plantar fascial fibromatosis: Secondary | ICD-10-CM | POA: Diagnosis not present

## 2022-12-21 DIAGNOSIS — M5136 Other intervertebral disc degeneration, lumbar region: Secondary | ICD-10-CM | POA: Diagnosis not present

## 2022-12-21 DIAGNOSIS — M359 Systemic involvement of connective tissue, unspecified: Secondary | ICD-10-CM | POA: Diagnosis not present

## 2022-12-21 DIAGNOSIS — L409 Psoriasis, unspecified: Secondary | ICD-10-CM | POA: Diagnosis not present

## 2022-12-22 DIAGNOSIS — E782 Mixed hyperlipidemia: Secondary | ICD-10-CM | POA: Diagnosis not present

## 2022-12-22 DIAGNOSIS — K5903 Drug induced constipation: Secondary | ICD-10-CM | POA: Diagnosis not present

## 2022-12-22 DIAGNOSIS — R7301 Impaired fasting glucose: Secondary | ICD-10-CM | POA: Diagnosis not present

## 2022-12-25 DIAGNOSIS — R7301 Impaired fasting glucose: Secondary | ICD-10-CM | POA: Diagnosis not present

## 2022-12-25 DIAGNOSIS — E782 Mixed hyperlipidemia: Secondary | ICD-10-CM | POA: Diagnosis not present

## 2022-12-31 DIAGNOSIS — I480 Paroxysmal atrial fibrillation: Secondary | ICD-10-CM | POA: Diagnosis not present

## 2022-12-31 DIAGNOSIS — E782 Mixed hyperlipidemia: Secondary | ICD-10-CM | POA: Diagnosis not present

## 2022-12-31 DIAGNOSIS — Z0000 Encounter for general adult medical examination without abnormal findings: Secondary | ICD-10-CM | POA: Diagnosis not present

## 2022-12-31 DIAGNOSIS — Z23 Encounter for immunization: Secondary | ICD-10-CM | POA: Diagnosis not present

## 2022-12-31 DIAGNOSIS — M545 Low back pain, unspecified: Secondary | ICD-10-CM | POA: Diagnosis not present

## 2022-12-31 DIAGNOSIS — H409 Unspecified glaucoma: Secondary | ICD-10-CM | POA: Diagnosis not present

## 2022-12-31 DIAGNOSIS — M359 Systemic involvement of connective tissue, unspecified: Secondary | ICD-10-CM | POA: Diagnosis not present

## 2022-12-31 DIAGNOSIS — L409 Psoriasis, unspecified: Secondary | ICD-10-CM | POA: Diagnosis not present

## 2023-01-20 DIAGNOSIS — R7301 Impaired fasting glucose: Secondary | ICD-10-CM | POA: Diagnosis not present

## 2023-01-20 DIAGNOSIS — K5903 Drug induced constipation: Secondary | ICD-10-CM | POA: Diagnosis not present

## 2023-01-20 DIAGNOSIS — E782 Mixed hyperlipidemia: Secondary | ICD-10-CM | POA: Diagnosis not present

## 2023-01-21 DIAGNOSIS — M47816 Spondylosis without myelopathy or radiculopathy, lumbar region: Secondary | ICD-10-CM | POA: Diagnosis not present

## 2023-01-21 DIAGNOSIS — M25561 Pain in right knee: Secondary | ICD-10-CM | POA: Diagnosis not present

## 2023-02-17 DIAGNOSIS — E782 Mixed hyperlipidemia: Secondary | ICD-10-CM | POA: Diagnosis not present

## 2023-02-17 DIAGNOSIS — R7301 Impaired fasting glucose: Secondary | ICD-10-CM | POA: Diagnosis not present

## 2023-02-17 DIAGNOSIS — K5903 Drug induced constipation: Secondary | ICD-10-CM | POA: Diagnosis not present

## 2023-02-23 DIAGNOSIS — E782 Mixed hyperlipidemia: Secondary | ICD-10-CM | POA: Diagnosis not present

## 2023-03-30 DIAGNOSIS — E782 Mixed hyperlipidemia: Secondary | ICD-10-CM | POA: Diagnosis not present

## 2023-03-30 DIAGNOSIS — K5903 Drug induced constipation: Secondary | ICD-10-CM | POA: Diagnosis not present

## 2023-03-30 DIAGNOSIS — R7301 Impaired fasting glucose: Secondary | ICD-10-CM | POA: Diagnosis not present

## 2023-04-22 DIAGNOSIS — M47816 Spondylosis without myelopathy or radiculopathy, lumbar region: Secondary | ICD-10-CM | POA: Diagnosis not present

## 2023-04-27 DIAGNOSIS — H401131 Primary open-angle glaucoma, bilateral, mild stage: Secondary | ICD-10-CM | POA: Diagnosis not present

## 2023-04-29 DIAGNOSIS — E782 Mixed hyperlipidemia: Secondary | ICD-10-CM | POA: Diagnosis not present

## 2023-04-29 DIAGNOSIS — K5903 Drug induced constipation: Secondary | ICD-10-CM | POA: Diagnosis not present

## 2023-04-29 DIAGNOSIS — R7301 Impaired fasting glucose: Secondary | ICD-10-CM | POA: Diagnosis not present

## 2023-05-04 DIAGNOSIS — E782 Mixed hyperlipidemia: Secondary | ICD-10-CM | POA: Diagnosis not present

## 2023-05-27 DIAGNOSIS — K5903 Drug induced constipation: Secondary | ICD-10-CM | POA: Diagnosis not present

## 2023-05-27 DIAGNOSIS — E782 Mixed hyperlipidemia: Secondary | ICD-10-CM | POA: Diagnosis not present

## 2023-05-27 DIAGNOSIS — R7301 Impaired fasting glucose: Secondary | ICD-10-CM | POA: Diagnosis not present

## 2023-06-14 ENCOUNTER — Other Ambulatory Visit: Payer: Self-pay | Admitting: Family Medicine

## 2023-06-14 ENCOUNTER — Ambulatory Visit
Admission: RE | Admit: 2023-06-14 | Discharge: 2023-06-14 | Disposition: A | Discharge status: Home / Self Care | Payer: Medicare Other | Source: Ambulatory Visit | Attending: Family Medicine | Admitting: Family Medicine

## 2023-06-14 DIAGNOSIS — Z0000 Encounter for general adult medical examination without abnormal findings: Secondary | ICD-10-CM

## 2023-06-14 DIAGNOSIS — Z1231 Encounter for screening mammogram for malignant neoplasm of breast: Secondary | ICD-10-CM | POA: Diagnosis not present

## 2023-06-16 DIAGNOSIS — L02423 Furuncle of right upper limb: Secondary | ICD-10-CM | POA: Diagnosis not present

## 2023-06-16 DIAGNOSIS — L718 Other rosacea: Secondary | ICD-10-CM | POA: Diagnosis not present

## 2023-06-16 DIAGNOSIS — L400 Psoriasis vulgaris: Secondary | ICD-10-CM | POA: Diagnosis not present

## 2023-06-16 DIAGNOSIS — L02424 Furuncle of left upper limb: Secondary | ICD-10-CM | POA: Diagnosis not present

## 2023-06-22 DIAGNOSIS — R233 Spontaneous ecchymoses: Secondary | ICD-10-CM | POA: Diagnosis not present

## 2023-06-22 DIAGNOSIS — M359 Systemic involvement of connective tissue, unspecified: Secondary | ICD-10-CM | POA: Diagnosis not present

## 2023-06-22 DIAGNOSIS — M1991 Primary osteoarthritis, unspecified site: Secondary | ICD-10-CM | POA: Diagnosis not present

## 2023-06-22 DIAGNOSIS — M722 Plantar fascial fibromatosis: Secondary | ICD-10-CM | POA: Diagnosis not present

## 2023-06-22 DIAGNOSIS — M5136 Other intervertebral disc degeneration, lumbar region: Secondary | ICD-10-CM | POA: Diagnosis not present

## 2023-06-22 DIAGNOSIS — L409 Psoriasis, unspecified: Secondary | ICD-10-CM | POA: Diagnosis not present

## 2023-06-29 DIAGNOSIS — R7301 Impaired fasting glucose: Secondary | ICD-10-CM | POA: Diagnosis not present

## 2023-06-29 DIAGNOSIS — K5903 Drug induced constipation: Secondary | ICD-10-CM | POA: Diagnosis not present

## 2023-06-29 DIAGNOSIS — E782 Mixed hyperlipidemia: Secondary | ICD-10-CM | POA: Diagnosis not present

## 2023-07-14 ENCOUNTER — Other Ambulatory Visit: Payer: Self-pay

## 2023-07-14 MED ORDER — METOPROLOL SUCCINATE ER 50 MG PO TB24: 50.0000 mg | ORAL_TABLET | Freq: Every day | ORAL | 0 refills | Status: DC

## 2023-07-19 DIAGNOSIS — L404 Guttate psoriasis: Secondary | ICD-10-CM | POA: Diagnosis not present

## 2023-07-19 DIAGNOSIS — L400 Psoriasis vulgaris: Secondary | ICD-10-CM | POA: Diagnosis not present

## 2023-07-22 DIAGNOSIS — M47816 Spondylosis without myelopathy or radiculopathy, lumbar region: Secondary | ICD-10-CM | POA: Diagnosis not present

## 2023-08-03 DIAGNOSIS — R7301 Impaired fasting glucose: Secondary | ICD-10-CM | POA: Diagnosis not present

## 2023-08-03 DIAGNOSIS — E782 Mixed hyperlipidemia: Secondary | ICD-10-CM | POA: Diagnosis not present

## 2023-08-03 DIAGNOSIS — K5903 Drug induced constipation: Secondary | ICD-10-CM | POA: Diagnosis not present

## 2023-08-31 DIAGNOSIS — R7301 Impaired fasting glucose: Secondary | ICD-10-CM | POA: Diagnosis not present

## 2023-08-31 DIAGNOSIS — E782 Mixed hyperlipidemia: Secondary | ICD-10-CM | POA: Diagnosis not present

## 2023-08-31 DIAGNOSIS — K5903 Drug induced constipation: Secondary | ICD-10-CM | POA: Diagnosis not present

## 2023-09-28 DIAGNOSIS — E782 Mixed hyperlipidemia: Secondary | ICD-10-CM | POA: Diagnosis not present

## 2023-09-28 DIAGNOSIS — R7301 Impaired fasting glucose: Secondary | ICD-10-CM | POA: Diagnosis not present

## 2023-09-28 DIAGNOSIS — K5903 Drug induced constipation: Secondary | ICD-10-CM | POA: Diagnosis not present

## 2023-10-14 DIAGNOSIS — M47816 Spondylosis without myelopathy or radiculopathy, lumbar region: Secondary | ICD-10-CM | POA: Diagnosis not present

## 2023-10-20 DIAGNOSIS — L404 Guttate psoriasis: Secondary | ICD-10-CM | POA: Diagnosis not present

## 2023-10-20 DIAGNOSIS — L400 Psoriasis vulgaris: Secondary | ICD-10-CM | POA: Diagnosis not present

## 2023-10-25 DIAGNOSIS — M359 Systemic involvement of connective tissue, unspecified: Secondary | ICD-10-CM | POA: Diagnosis not present

## 2023-10-25 DIAGNOSIS — M5136 Other intervertebral disc degeneration, lumbar region with discogenic back pain only: Secondary | ICD-10-CM | POA: Diagnosis not present

## 2023-10-25 DIAGNOSIS — M1991 Primary osteoarthritis, unspecified site: Secondary | ICD-10-CM | POA: Diagnosis not present

## 2023-10-25 DIAGNOSIS — R233 Spontaneous ecchymoses: Secondary | ICD-10-CM | POA: Diagnosis not present

## 2023-10-25 DIAGNOSIS — M722 Plantar fascial fibromatosis: Secondary | ICD-10-CM | POA: Diagnosis not present

## 2023-10-25 DIAGNOSIS — L409 Psoriasis, unspecified: Secondary | ICD-10-CM | POA: Diagnosis not present

## 2023-10-26 DIAGNOSIS — R7301 Impaired fasting glucose: Secondary | ICD-10-CM | POA: Diagnosis not present

## 2023-10-26 DIAGNOSIS — K5903 Drug induced constipation: Secondary | ICD-10-CM | POA: Diagnosis not present

## 2023-10-26 DIAGNOSIS — E782 Mixed hyperlipidemia: Secondary | ICD-10-CM | POA: Diagnosis not present

## 2023-11-06 ENCOUNTER — Other Ambulatory Visit: Payer: Self-pay | Admitting: Physician Assistant

## 2023-11-21 ENCOUNTER — Other Ambulatory Visit: Payer: Self-pay | Admitting: Physician Assistant

## 2023-11-30 DIAGNOSIS — K5903 Drug induced constipation: Secondary | ICD-10-CM | POA: Diagnosis not present

## 2023-11-30 DIAGNOSIS — E782 Mixed hyperlipidemia: Secondary | ICD-10-CM | POA: Diagnosis not present

## 2023-11-30 DIAGNOSIS — R7301 Impaired fasting glucose: Secondary | ICD-10-CM | POA: Diagnosis not present

## 2023-12-03 ENCOUNTER — Other Ambulatory Visit: Payer: Self-pay | Admitting: Physician Assistant

## 2023-12-05 ENCOUNTER — Other Ambulatory Visit: Payer: Self-pay | Admitting: Physician Assistant

## 2023-12-06 NOTE — Telephone Encounter (Signed)
Pt has had 3 attempts, still no pending appt. Please review for refill. Thanks

## 2023-12-07 NOTE — Progress Notes (Unsigned)
Cardiology Office Note Date:  12/07/2023  Patient ID:  Stacy Silva, Stacy Silva 1956/06/16, MRN [id number] PCP:  Gweneth Dimitri, MD  Electrophysiologist: Dr. Johney Frame Rheumatology: Dr. Dierdre Forth   Chief Complaint:  *** annual visit  History of Present Illness: Stacy Silva is a 67 y.o. 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 Johney Frame, last seen by him via tele health visit, she was doing well, did not want to stop her BB.  She was not on a/c with score of one (female)  I saw her March 2022 She is doing really well. Has never had to use her PRN dilt since her ablation. Feels best keeping the metoprolol on board. No dizziness, weakness, near syncope or syncope. No CP, palpitations or cardiac awareness. No SOB or DOE She is actively working on losing weight and has lost a few pounds, does not exercise, used to participate in water aerobics, but since [place] stopped the gym.  Encouraged to find exercising that her back/kneess can tolerate. Labs are done with her PMD, last lipid check reported to be good No changes made.  I saw her May 2023 She continues to do very well She started a new OTC supplement with great improvement in her back/generalized pain. Following regularly with her PMD, gets her vaccinations/labs are all up to date Walking on the treadmill day No CP, palpitations or cardiac awareness She does not think she has had any AFib No dizziness, near syncope or syncope. No SOB, DOE No changes were made Risk score 2 (including gender), not on a/c    *** needs new MD *** symptoms *** any new dx, risk score? *** labs, lipids....     AFib Hx Diagnosed 2010 PVI/CTI ablation 06/23/2016   AAD  Flecainide 2014 > d/c Aug 2017 post ablation   Past Medical History:  Diagnosis Date   DDD (degenerative disc  disease)    Glaucoma    Hyperlipidemia    Insomnia    Myalgia and myositis, unspecified    Obesity    Paroxysmal atrial fibrillation (HCC)    Postmenopausal    Sinus bradycardia     Past Surgical History:  Procedure Laterality Date   APPENDECTOMY     BUNIONECTOMY     ELECTROPHYSIOLOGIC STUDY N/A 06/23/2016   PVI and CTI by Dr Johney Frame   LAMINECTOMY     TONSILLECTOMY      Current Outpatient Medications  Medication Sig Dispense Refill   aspirin 81 MG EC tablet Take 1 tablet by mouth daily.     diltiazem (CARDIZEM) 60 MG tablet Take 1 tablet (60 mg total) by mouth daily as needed (heart rate). 30 tablet 3   docusate sodium (COLACE) 100 MG capsule Take 100 mg by mouth 2 (two) times daily. (Patient not taking: Reported on 04/17/2022)     DULoxetine (CYMBALTA) 60 MG capsule Take 60 mg by mouth daily.     ELIDEL 1 % cream Apply 1 application topically daily.     HYDROcodone-acetaminophen (NORCO/VICODIN) 5-325 MG per tablet Take 1-2 tablets by mouth every 6 (six) hours as needed for moderate pain.   0   hydroxychloroquine (PLAQUENIL) 200 MG tablet Take 200 mg by mouth 2 (two) times daily.  0   latanoprost (XALATAN) 0.005 % ophthalmic solution Place 1 drop into both eyes as directed.     metoprolol succinate (  TOPROL-XL) 50 MG 24 hr tablet TAKE 1 TABLET BY MOUTH DAILY WITH OR IMMEDIATELY FOLLOWING A MEAL 15 tablet 0   Misc Natural Products (TART CHERRY ADVANCED PO) Take 3 capsules by mouth daily.     Multiple Vitamin (MULTIVITAMIN) capsule Take 1 capsule by mouth daily.     NON FORMULARY Take 1 tablet by mouth daily. PROTANDIM antioxidant     ondansetron (ZOFRAN) 8 MG tablet Take 8 mg by mouth as needed for nausea (1/2 hour before pain meds). (Patient not taking: Reported on 04/17/2022)     tiZANidine (ZANAFLEX) 4 MG tablet Take 4 mg by mouth as directed.     traMADol (ULTRAM) 50 MG tablet Take 50 mg by mouth every 6 (six) hours as needed for moderate pain.   0   No current  facility-administered medications for this visit.    Allergies:   Erythromycin, Pravastatin, Amoxil [amoxicillin], and Tape   Social History:  The patient  reports that she has quit smoking. She has never used smokeless tobacco. She reports that she does not drink alcohol and does not use drugs.   Family History:  The patient's family history includes Breast cancer in her maternal grandmother; CAD in her father; Congestive Heart Failure in her father; Heart attack in her father, maternal grandfather, and paternal grandfather.  ROS:  Please see the history of present illness.    All other systems are reviewed and otherwise negative.   PHYSICAL EXAM:  VS:  There were no vitals taken for this visit. BMI: There is no height or weight on file to calculate BMI. Well nourished, well developed, in no acute distress HEENT: normocephalic, atraumatic Neck: no JVD, carotid bruits or masses Cardiac: *** RRR; no significant murmurs, no rubs, or gallops Lungs: *** CTA b/l, no wheezing, rhonchi or rales Abd: soft, nontender MS: no deformity or atrophy Ext: *** no edema Skin: warm and dry, no rash Neuro:  No gross deficits appreciated Psych: euthymic mood, full affect     EKG:  Done today and reviewed by myself shows  ***  06/23/2016; EPS/ablation CONCLUSIONS: 1. Sinus rhythm upon presentation.   2. Intracardiac echo reveals a moderate sized left atrium with four separate pulmonary veins without evidence of pulmonary vein stenosis. 3. Successful electrical isolation and anatomical encircling of all four pulmonary veins with radiofrequency current.    4. Isthmus dependant right atrial flutter induced with dobutamine and successfully ablated along the cavotricuspid isthmus. 5. No early apparent complications.   05/06/2016: TTE Study Conclusions  - Left ventricle: The cavity size was normal. Posterior wall    thickness was increased in a pattern of mild LVH. Systolic    function was normal.  The estimated ejection fraction was in the    range of 60% to 65%. Wall motion was normal; there were no    regional wall motion abnormalities. Features are consistent with    a pseudonormal left ventricular filling pattern, with concomitant    abnormal relaxation and indeterminate filling pressure (grade 2    diastolic dysfunction).  - Aortic valve: Transvalvular velocity was within the normal range.    There was no stenosis. There was no regurgitation.  - Mitral valve: There was trivial regurgitation.  - Left atrium: The atrium was mildly dilated.  - Right ventricle: The cavity size was normal. Wall thickness was    normal. Systolic function was normal.  - Atrial septum: No defect or patent foramen ovale was identified    by color flow Doppler.  -  Tricuspid valve: There was no regurgitation.     Recent Labs: No results found for requested labs within last 365 days.  No results found for requested labs within last 365 days.   CrCl cannot be calculated (Patient's most recent lab result is older than the maximum 21 days allowed.).   Wt Readings from Last 3 Encounters:  04/17/22 201 lb 3.2 oz (91.3 kg)  02/19/21 195 lb (88.5 kg)  01/08/20 203 lb (92.1 kg)     Other studies reviewed: Additional studies/records reviewed today include: summarized above  ASSESSMENT AND PLAN:  1. Paroxysmal Afib     CHA2DS2vasc is *** 2 gender/age, *** not on a/c     *** No symptoms to suggest recurrent arrhythmia   Disposition: ***  Current medicines are reviewed at length with the patient today.  The patient did not have any concerns regarding medicines.  Norma Fredrickson, PA-C 12/07/2023 7:49 AM     [facility] [address] [place] [address] [phone] (office)  [phone] (fax)

## 2023-12-09 ENCOUNTER — Ambulatory Visit: Payer: Medicare Other | Attending: Physician Assistant | Admitting: Physician Assistant

## 2023-12-09 ENCOUNTER — Encounter: Payer: Self-pay | Admitting: Physician Assistant

## 2023-12-09 VITALS — BP 122/78 | HR 75 | Ht 62.0 in | Wt 174.8 lb

## 2023-12-09 DIAGNOSIS — I480 Paroxysmal atrial fibrillation: Secondary | ICD-10-CM | POA: Diagnosis not present

## 2023-12-09 MED ORDER — METOPROLOL SUCCINATE ER 50 MG PO TB24: 50.0000 mg | ORAL_TABLET | Freq: Every day | ORAL | 3 refills | Status: DC

## 2023-12-09 NOTE — Patient Instructions (Signed)
Medication Instructions:   Your physician recommends that you continue on your current medications as directed. Please refer to the Current Medication list given to you today.   *If you need a refill on your cardiac medications before your next appointment, please call your pharmacy*   Lab Work:  NONE ORDERED  TODAY      If you have labs (blood work) drawn today and your tests are completely normal, you will receive your results only by: MyChart Message (if you have MyChart) OR A paper copy in the mail If you have any lab test that is abnormal or we need to change your treatment, we will call you to review the results.    Testing/Procedures: NONE ORDERED  TODAY      Follow-Up: At [facility], you and your health needs are our priority.  As part of our continuing mission to provide you with exceptional heart care, we have created designated Provider Care Teams.  These Care Teams include your primary Cardiologist (physician) and Advanced Practice Providers (APPs -  Physician Assistants and Nurse Practitioners) who all work together to provide you with the care you need, when you need it.  We recommend signing up for the patient portal called "MyChart".  Sign up information is provided on this After Visit Summary.  MyChart is used to connect with patients for Virtual Visits (Telemedicine).  Patients are able to view lab/test results, encounter notes, upcoming appointments, etc.  Non-urgent messages can be sent to your provider as well.   To learn more about what you can do with MyChart, go to [email or web address].    Your next appointment:    1 year(s)  Provider:   You may see  Dr. Jimmey Ralph   Other Instructions

## 2023-12-20 DIAGNOSIS — H401131 Primary open-angle glaucoma, bilateral, mild stage: Secondary | ICD-10-CM | POA: Diagnosis not present

## 2023-12-20 DIAGNOSIS — H2513 Age-related nuclear cataract, bilateral: Secondary | ICD-10-CM | POA: Diagnosis not present

## 2023-12-20 DIAGNOSIS — H04123 Dry eye syndrome of bilateral lacrimal glands: Secondary | ICD-10-CM | POA: Diagnosis not present

## 2023-12-30 DIAGNOSIS — E782 Mixed hyperlipidemia: Secondary | ICD-10-CM | POA: Diagnosis not present

## 2023-12-30 DIAGNOSIS — R7301 Impaired fasting glucose: Secondary | ICD-10-CM | POA: Diagnosis not present

## 2024-01-06 DIAGNOSIS — M359 Systemic involvement of connective tissue, unspecified: Secondary | ICD-10-CM | POA: Diagnosis not present

## 2024-01-06 DIAGNOSIS — T887XXA Unspecified adverse effect of drug or medicament, initial encounter: Secondary | ICD-10-CM | POA: Diagnosis not present

## 2024-01-06 DIAGNOSIS — M545 Low back pain, unspecified: Secondary | ICD-10-CM | POA: Diagnosis not present

## 2024-01-06 DIAGNOSIS — E782 Mixed hyperlipidemia: Secondary | ICD-10-CM | POA: Diagnosis not present

## 2024-01-06 DIAGNOSIS — I480 Paroxysmal atrial fibrillation: Secondary | ICD-10-CM | POA: Diagnosis not present

## 2024-01-06 DIAGNOSIS — H409 Unspecified glaucoma: Secondary | ICD-10-CM | POA: Diagnosis not present

## 2024-01-06 DIAGNOSIS — Z0000 Encounter for general adult medical examination without abnormal findings: Secondary | ICD-10-CM | POA: Diagnosis not present

## 2024-01-06 DIAGNOSIS — R7301 Impaired fasting glucose: Secondary | ICD-10-CM | POA: Diagnosis not present

## 2024-01-11 DIAGNOSIS — K5903 Drug induced constipation: Secondary | ICD-10-CM | POA: Diagnosis not present

## 2024-01-11 DIAGNOSIS — R7301 Impaired fasting glucose: Secondary | ICD-10-CM | POA: Diagnosis not present

## 2024-01-11 DIAGNOSIS — E782 Mixed hyperlipidemia: Secondary | ICD-10-CM | POA: Diagnosis not present

## 2024-01-14 DIAGNOSIS — M5416 Radiculopathy, lumbar region: Secondary | ICD-10-CM | POA: Diagnosis not present

## 2024-01-14 DIAGNOSIS — M47816 Spondylosis without myelopathy or radiculopathy, lumbar region: Secondary | ICD-10-CM | POA: Diagnosis not present

## 2024-02-15 DIAGNOSIS — K5903 Drug induced constipation: Secondary | ICD-10-CM | POA: Diagnosis not present

## 2024-02-15 DIAGNOSIS — E782 Mixed hyperlipidemia: Secondary | ICD-10-CM | POA: Diagnosis not present

## 2024-02-15 DIAGNOSIS — R7301 Impaired fasting glucose: Secondary | ICD-10-CM | POA: Diagnosis not present

## 2024-03-21 DIAGNOSIS — J40 Bronchitis, not specified as acute or chronic: Secondary | ICD-10-CM | POA: Diagnosis not present

## 2024-03-28 DIAGNOSIS — R7301 Impaired fasting glucose: Secondary | ICD-10-CM | POA: Diagnosis not present

## 2024-03-28 DIAGNOSIS — K5903 Drug induced constipation: Secondary | ICD-10-CM | POA: Diagnosis not present

## 2024-03-28 DIAGNOSIS — E782 Mixed hyperlipidemia: Secondary | ICD-10-CM | POA: Diagnosis not present

## 2024-04-04 DIAGNOSIS — E782 Mixed hyperlipidemia: Secondary | ICD-10-CM | POA: Diagnosis not present

## 2024-04-11 DIAGNOSIS — M47816 Spondylosis without myelopathy or radiculopathy, lumbar region: Secondary | ICD-10-CM | POA: Diagnosis not present

## 2024-04-19 DIAGNOSIS — H2513 Age-related nuclear cataract, bilateral: Secondary | ICD-10-CM | POA: Diagnosis not present

## 2024-04-19 DIAGNOSIS — H401131 Primary open-angle glaucoma, bilateral, mild stage: Secondary | ICD-10-CM | POA: Diagnosis not present

## 2024-04-19 DIAGNOSIS — H04123 Dry eye syndrome of bilateral lacrimal glands: Secondary | ICD-10-CM | POA: Diagnosis not present

## 2024-04-24 DIAGNOSIS — K5903 Drug induced constipation: Secondary | ICD-10-CM | POA: Diagnosis not present

## 2024-04-24 DIAGNOSIS — R7301 Impaired fasting glucose: Secondary | ICD-10-CM | POA: Diagnosis not present

## 2024-04-24 DIAGNOSIS — E782 Mixed hyperlipidemia: Secondary | ICD-10-CM | POA: Diagnosis not present

## 2024-05-23 DIAGNOSIS — R7301 Impaired fasting glucose: Secondary | ICD-10-CM | POA: Diagnosis not present

## 2024-05-23 DIAGNOSIS — E782 Mixed hyperlipidemia: Secondary | ICD-10-CM | POA: Diagnosis not present

## 2024-05-23 DIAGNOSIS — K5903 Drug induced constipation: Secondary | ICD-10-CM | POA: Diagnosis not present

## 2024-06-12 DIAGNOSIS — E782 Mixed hyperlipidemia: Secondary | ICD-10-CM | POA: Diagnosis not present

## 2024-07-04 DIAGNOSIS — R7301 Impaired fasting glucose: Secondary | ICD-10-CM | POA: Diagnosis not present

## 2024-07-04 DIAGNOSIS — K5903 Drug induced constipation: Secondary | ICD-10-CM | POA: Diagnosis not present

## 2024-07-04 DIAGNOSIS — E782 Mixed hyperlipidemia: Secondary | ICD-10-CM | POA: Diagnosis not present

## 2024-07-13 DIAGNOSIS — E782 Mixed hyperlipidemia: Secondary | ICD-10-CM | POA: Diagnosis not present

## 2024-07-18 DIAGNOSIS — E782 Mixed hyperlipidemia: Secondary | ICD-10-CM | POA: Diagnosis not present

## 2024-07-27 DIAGNOSIS — M47816 Spondylosis without myelopathy or radiculopathy, lumbar region: Secondary | ICD-10-CM | POA: Diagnosis not present

## 2024-08-01 ENCOUNTER — Other Ambulatory Visit: Payer: Self-pay | Admitting: Family Medicine

## 2024-08-01 DIAGNOSIS — Z1231 Encounter for screening mammogram for malignant neoplasm of breast: Secondary | ICD-10-CM

## 2024-08-10 DIAGNOSIS — D225 Melanocytic nevi of trunk: Secondary | ICD-10-CM | POA: Diagnosis not present

## 2024-08-10 DIAGNOSIS — L404 Guttate psoriasis: Secondary | ICD-10-CM | POA: Diagnosis not present

## 2024-08-10 DIAGNOSIS — L821 Other seborrheic keratosis: Secondary | ICD-10-CM | POA: Diagnosis not present

## 2024-08-10 DIAGNOSIS — L814 Other melanin hyperpigmentation: Secondary | ICD-10-CM | POA: Diagnosis not present

## 2024-08-10 DIAGNOSIS — Z789 Other specified health status: Secondary | ICD-10-CM | POA: Diagnosis not present

## 2024-08-10 DIAGNOSIS — L2989 Other pruritus: Secondary | ICD-10-CM | POA: Diagnosis not present

## 2024-08-10 DIAGNOSIS — L400 Psoriasis vulgaris: Secondary | ICD-10-CM | POA: Diagnosis not present

## 2024-08-10 DIAGNOSIS — D692 Other nonthrombocytopenic purpura: Secondary | ICD-10-CM | POA: Diagnosis not present

## 2024-08-10 DIAGNOSIS — L820 Inflamed seborrheic keratosis: Secondary | ICD-10-CM | POA: Diagnosis not present

## 2024-08-13 DIAGNOSIS — E782 Mixed hyperlipidemia: Secondary | ICD-10-CM | POA: Diagnosis not present

## 2024-08-15 DIAGNOSIS — K5903 Drug induced constipation: Secondary | ICD-10-CM | POA: Diagnosis not present

## 2024-08-15 DIAGNOSIS — R7301 Impaired fasting glucose: Secondary | ICD-10-CM | POA: Diagnosis not present

## 2024-08-15 DIAGNOSIS — E782 Mixed hyperlipidemia: Secondary | ICD-10-CM | POA: Diagnosis not present

## 2024-08-17 ENCOUNTER — Ambulatory Visit
Admission: RE | Admit: 2024-08-17 | Discharge: 2024-08-17 | Disposition: A | Discharge status: Home / Self Care | Source: Ambulatory Visit | Attending: Family Medicine | Admitting: Family Medicine

## 2024-08-17 DIAGNOSIS — Z1231 Encounter for screening mammogram for malignant neoplasm of breast: Secondary | ICD-10-CM

## 2024-08-22 DIAGNOSIS — H04123 Dry eye syndrome of bilateral lacrimal glands: Secondary | ICD-10-CM | POA: Diagnosis not present

## 2024-08-22 DIAGNOSIS — H2513 Age-related nuclear cataract, bilateral: Secondary | ICD-10-CM | POA: Diagnosis not present

## 2024-08-22 DIAGNOSIS — H401131 Primary open-angle glaucoma, bilateral, mild stage: Secondary | ICD-10-CM | POA: Diagnosis not present

## 2024-09-12 DIAGNOSIS — E782 Mixed hyperlipidemia: Secondary | ICD-10-CM | POA: Diagnosis not present

## 2024-10-05 DIAGNOSIS — E782 Mixed hyperlipidemia: Secondary | ICD-10-CM | POA: Diagnosis not present

## 2024-10-05 DIAGNOSIS — K5903 Drug induced constipation: Secondary | ICD-10-CM | POA: Diagnosis not present

## 2024-10-05 DIAGNOSIS — R7301 Impaired fasting glucose: Secondary | ICD-10-CM | POA: Diagnosis not present

## 2024-11-16 ENCOUNTER — Other Ambulatory Visit (HOSPITAL_COMMUNITY): Payer: Self-pay | Admitting: Family Medicine

## 2024-11-16 DIAGNOSIS — E782 Mixed hyperlipidemia: Secondary | ICD-10-CM

## 2024-11-28 ENCOUNTER — Inpatient Hospital Stay (HOSPITAL_COMMUNITY)
Admission: RE | Admit: 2024-11-28 | Discharge: 2024-11-28 | Payer: Self-pay | Attending: Family Medicine | Admitting: Family Medicine

## 2024-11-28 DIAGNOSIS — E782 Mixed hyperlipidemia: Secondary | ICD-10-CM | POA: Insufficient documentation

## 2024-11-30 ENCOUNTER — Other Ambulatory Visit: Payer: Self-pay | Admitting: Physician Assistant

## 2025-01-31 ENCOUNTER — Ambulatory Visit: Admitting: Cardiology
